# Patient Record
Sex: Male | Born: 1945 | Race: White | Hispanic: No | Marital: Married | Smoking: Former smoker
Health system: Southern US, Community
[De-identification: ages and names within clinical notes are randomized; demographics above are authoritative.]

## PROBLEM LIST (undated history)

## (undated) DIAGNOSIS — I10 Essential (primary) hypertension: Secondary | ICD-10-CM

## (undated) DIAGNOSIS — C719 Malignant neoplasm of brain, unspecified: Secondary | ICD-10-CM

## (undated) DIAGNOSIS — E119 Type 2 diabetes mellitus without complications: Secondary | ICD-10-CM

## (undated) DIAGNOSIS — C801 Malignant (primary) neoplasm, unspecified: Secondary | ICD-10-CM

## (undated) DIAGNOSIS — N200 Calculus of kidney: Secondary | ICD-10-CM

## (undated) DIAGNOSIS — M1990 Unspecified osteoarthritis, unspecified site: Secondary | ICD-10-CM

## (undated) DIAGNOSIS — C3490 Malignant neoplasm of unspecified part of unspecified bronchus or lung: Secondary | ICD-10-CM

## (undated) DIAGNOSIS — R55 Syncope and collapse: Secondary | ICD-10-CM

## (undated) DIAGNOSIS — J189 Pneumonia, unspecified organism: Secondary | ICD-10-CM

## (undated) DIAGNOSIS — D649 Anemia, unspecified: Secondary | ICD-10-CM

## (undated) DIAGNOSIS — R0609 Other forms of dyspnea: Secondary | ICD-10-CM

## (undated) DIAGNOSIS — R0600 Dyspnea, unspecified: Secondary | ICD-10-CM

## (undated) DIAGNOSIS — K219 Gastro-esophageal reflux disease without esophagitis: Secondary | ICD-10-CM

## (undated) DIAGNOSIS — G4730 Sleep apnea, unspecified: Secondary | ICD-10-CM

## (undated) HISTORY — PX: EYE SURGERY: SHX253

## (undated) HISTORY — PX: TONSILLECTOMY: SUR1361

## (undated) HISTORY — PX: CARDIAC CATHETERIZATION: SHX172

## (undated) HISTORY — PX: HIP SURGERY: SHX245

---

## 2000-01-20 ENCOUNTER — Encounter: Payer: Self-pay | Admitting: Emergency Medicine

## 2000-01-20 ENCOUNTER — Emergency Department (HOSPITAL_COMMUNITY): Admission: EM | Admit: 2000-01-20 | Discharge: 2000-01-20 | Payer: Self-pay | Admitting: *Deleted

## 2000-01-31 ENCOUNTER — Ambulatory Visit (HOSPITAL_COMMUNITY): Admission: RE | Admit: 2000-01-31 | Discharge: 2000-01-31 | Payer: Self-pay | Admitting: Urology

## 2000-01-31 ENCOUNTER — Encounter: Payer: Self-pay | Admitting: Urology

## 2001-12-09 ENCOUNTER — Emergency Department (HOSPITAL_COMMUNITY): Admission: EM | Admit: 2001-12-09 | Discharge: 2001-12-09 | Payer: Self-pay | Admitting: Emergency Medicine

## 2003-03-18 ENCOUNTER — Emergency Department (HOSPITAL_COMMUNITY): Admission: EM | Admit: 2003-03-18 | Discharge: 2003-03-18 | Payer: Self-pay | Admitting: Emergency Medicine

## 2003-10-30 ENCOUNTER — Emergency Department (HOSPITAL_COMMUNITY): Admission: EM | Admit: 2003-10-30 | Discharge: 2003-10-30 | Payer: Self-pay | Admitting: Emergency Medicine

## 2003-12-16 ENCOUNTER — Ambulatory Visit (HOSPITAL_COMMUNITY): Admission: RE | Admit: 2003-12-16 | Discharge: 2003-12-16 | Payer: Self-pay | Admitting: *Deleted

## 2005-03-19 ENCOUNTER — Emergency Department (HOSPITAL_COMMUNITY): Admission: EM | Admit: 2005-03-19 | Discharge: 2005-03-19 | Payer: Self-pay | Admitting: Emergency Medicine

## 2010-06-30 ENCOUNTER — Other Ambulatory Visit (HOSPITAL_COMMUNITY): Payer: Self-pay | Admitting: Cardiovascular Disease

## 2010-06-30 DIAGNOSIS — I6529 Occlusion and stenosis of unspecified carotid artery: Secondary | ICD-10-CM

## 2010-07-07 ENCOUNTER — Ambulatory Visit (HOSPITAL_COMMUNITY)
Admission: RE | Admit: 2010-07-07 | Discharge: 2010-07-07 | Disposition: A | Discharge status: Home / Self Care | Payer: 59 | Source: Ambulatory Visit | Attending: Cardiovascular Disease | Admitting: Cardiovascular Disease

## 2010-07-07 DIAGNOSIS — I6529 Occlusion and stenosis of unspecified carotid artery: Secondary | ICD-10-CM | POA: Insufficient documentation

## 2010-07-07 MED ORDER — IOHEXOL 350 MG/ML SOLN
100.0000 mL | Freq: Once | INTRAVENOUS | Status: AC | PRN
  Administered 2010-07-07: 100 mL via INTRAVENOUS

## 2011-03-08 HISTORY — PX: CATARACT EXTRACTION W/ INTRAOCULAR LENS  IMPLANT, BILATERAL: SHX1307

## 2011-05-11 ENCOUNTER — Ambulatory Visit
Admission: RE | Admit: 2011-05-11 | Discharge: 2011-05-11 | Disposition: A | Discharge status: Home / Self Care | Payer: 59 | Source: Ambulatory Visit | Attending: Cardiovascular Disease | Admitting: Cardiovascular Disease

## 2011-05-11 ENCOUNTER — Other Ambulatory Visit: Payer: Self-pay | Admitting: Cardiovascular Disease

## 2011-05-11 DIAGNOSIS — F17200 Nicotine dependence, unspecified, uncomplicated: Secondary | ICD-10-CM

## 2011-05-14 ENCOUNTER — Encounter (HOSPITAL_COMMUNITY): Payer: Self-pay | Admitting: Pharmacy Technician

## 2011-05-15 ENCOUNTER — Encounter (HOSPITAL_COMMUNITY): Payer: Self-pay | Admitting: *Deleted

## 2011-05-15 ENCOUNTER — Encounter (HOSPITAL_COMMUNITY)
Admission: RE | Disposition: A | Discharge status: Home / Self Care | Payer: Self-pay | Source: Ambulatory Visit | Attending: Surgery

## 2011-05-15 ENCOUNTER — Inpatient Hospital Stay (HOSPITAL_COMMUNITY)
Admission: RE | Admit: 2011-05-15 | Discharge: 2011-05-16 | DRG: 036 | Disposition: A | Discharge status: Home / Self Care | Payer: 59 | Source: Ambulatory Visit | Attending: Surgery | Admitting: Surgery

## 2011-05-15 DIAGNOSIS — I658 Occlusion and stenosis of other precerebral arteries: Secondary | ICD-10-CM | POA: Diagnosis present

## 2011-05-15 DIAGNOSIS — Z8249 Family history of ischemic heart disease and other diseases of the circulatory system: Secondary | ICD-10-CM

## 2011-05-15 DIAGNOSIS — I6529 Occlusion and stenosis of unspecified carotid artery: Principal | ICD-10-CM | POA: Diagnosis present

## 2011-05-15 DIAGNOSIS — D649 Anemia, unspecified: Secondary | ICD-10-CM | POA: Diagnosis present

## 2011-05-15 DIAGNOSIS — I10 Essential (primary) hypertension: Secondary | ICD-10-CM | POA: Diagnosis present

## 2011-05-15 DIAGNOSIS — F17200 Nicotine dependence, unspecified, uncomplicated: Secondary | ICD-10-CM | POA: Diagnosis present

## 2011-05-15 DIAGNOSIS — E119 Type 2 diabetes mellitus without complications: Secondary | ICD-10-CM | POA: Diagnosis present

## 2011-05-15 DIAGNOSIS — R079 Chest pain, unspecified: Secondary | ICD-10-CM | POA: Diagnosis present

## 2011-05-15 DIAGNOSIS — I2510 Atherosclerotic heart disease of native coronary artery without angina pectoris: Secondary | ICD-10-CM | POA: Diagnosis present

## 2011-05-15 HISTORY — PX: CAROTID STENT INSERTION: SHX5505

## 2011-05-15 HISTORY — PX: LEFT HEART CATHETERIZATION WITH CORONARY ANGIOGRAM: SHX5451

## 2011-05-15 HISTORY — DX: Anemia, unspecified: D64.9

## 2011-05-15 HISTORY — DX: Essential (primary) hypertension: I10

## 2011-05-15 HISTORY — PX: CAROTID ENDARTERECTOMY: SUR193

## 2011-05-15 LAB — GLUCOSE, CAPILLARY
Glucose-Capillary: 105 mg/dL — ABNORMAL HIGH (ref 70–99)
Glucose-Capillary: 236 mg/dL — ABNORMAL HIGH (ref 70–99)
Glucose-Capillary: 120 mg/dL — ABNORMAL HIGH (ref 70–99)
Glucose-Capillary: 205 mg/dL — ABNORMAL HIGH (ref 70–99)

## 2011-05-15 LAB — POCT ACTIVATED CLOTTING TIME: Activated Clotting Time: 292 seconds

## 2011-05-15 SURGERY — LEFT HEART CATHETERIZATION WITH CORONARY ANGIOGRAM
Anesthesia: LOCAL

## 2011-05-15 SURGERY — CAROTID STENT INSERTION
Anesthesia: LOCAL

## 2011-05-15 MED ORDER — ASPIRIN EC 81 MG PO TBEC: 81.0000 mg | DELAYED_RELEASE_TABLET | Freq: Every day | ORAL | Status: DC

## 2011-05-15 MED ORDER — OLMESARTAN MEDOXOMIL 20 MG PO TABS
20.0000 mg | ORAL_TABLET | Freq: Every day | ORAL | Status: DC
  Administered 2011-05-15: 20 mg via ORAL
  Filled 2011-05-15 (×2): qty 1

## 2011-05-15 MED ORDER — SODIUM CHLORIDE 0.9 % IJ SOLN: 3.0000 mL | INTRAMUSCULAR | Status: DC | PRN

## 2011-05-15 MED ORDER — PIOGLITAZONE HCL-METFORMIN HCL 15-850 MG PO TABS: 1.0000 | ORAL_TABLET | ORAL | Status: DC

## 2011-05-15 MED ORDER — ASPIRIN 81 MG PO CHEW
324.0000 mg | CHEWABLE_TABLET | Freq: Once | ORAL | Status: AC
  Administered 2011-05-15: 324 mg via ORAL

## 2011-05-15 MED ORDER — CLOPIDOGREL BISULFATE 75 MG PO TABS: 75.0000 mg | ORAL_TABLET | ORAL | Status: DC

## 2011-05-15 MED ORDER — FERROUS SULFATE 325 (65 FE) MG PO TABS
325.0000 mg | ORAL_TABLET | Freq: Every day | ORAL | Status: DC
Start: 2011-05-16 — End: 2011-05-16
  Administered 2011-05-16: 325 mg via ORAL
  Filled 2011-05-15 (×2): qty 1

## 2011-05-15 MED ORDER — ACETAMINOPHEN 325 MG PO TABS: 650.0000 mg | ORAL_TABLET | ORAL | Status: DC | PRN

## 2011-05-15 MED ORDER — METFORMIN HCL 850 MG PO TABS
850.0000 mg | ORAL_TABLET | Freq: Every day | ORAL | Status: DC
  Filled 2011-05-15 (×2): qty 1

## 2011-05-15 MED ORDER — SODIUM CHLORIDE 0.9 % IV SOLN: INTRAVENOUS | Status: AC

## 2011-05-15 MED ORDER — PIOGLITAZONE HCL 15 MG PO TABS
15.0000 mg | ORAL_TABLET | Freq: Every day | ORAL | Status: DC
  Administered 2011-05-16: 15 mg via ORAL
  Filled 2011-05-15 (×2): qty 1

## 2011-05-15 MED ORDER — NOREPINEPHRINE BITARTRATE 1 MG/ML IJ SOLN
INTRAMUSCULAR | Status: AC
  Filled 2011-05-15: qty 4

## 2011-05-15 MED ORDER — ONDANSETRON HCL 4 MG/2ML IJ SOLN: 4.0000 mg | Freq: Four times a day (QID) | INTRAMUSCULAR | Status: DC | PRN

## 2011-05-15 MED ORDER — ATROPINE SULFATE 1 MG/ML IJ SOLN
INTRAMUSCULAR | Status: AC
  Filled 2011-05-15: qty 1

## 2011-05-15 MED ORDER — SODIUM CHLORIDE 0.9 % IV SOLN
INTRAVENOUS | Status: DC
  Administered 2011-05-15: 06:00:00 via INTRAVENOUS

## 2011-05-15 MED ORDER — ASPIRIN 81 MG PO CHEW
CHEWABLE_TABLET | ORAL | Status: AC
  Filled 2011-05-15: qty 4

## 2011-05-15 MED ORDER — HEPARIN (PORCINE) IN NACL 2-0.9 UNIT/ML-% IJ SOLN
INTRAMUSCULAR | Status: AC
  Filled 2011-05-15: qty 1000

## 2011-05-15 MED ORDER — CLOPIDOGREL BISULFATE 75 MG PO TABS
75.0000 mg | ORAL_TABLET | Freq: Every day | ORAL | Status: DC
  Filled 2011-05-15 (×2): qty 1

## 2011-05-15 MED ORDER — LIDOCAINE HCL (PF) 1 % IJ SOLN
INTRAMUSCULAR | Status: AC
  Filled 2011-05-15: qty 30

## 2011-05-15 MED ORDER — BIVALIRUDIN 250 MG IV SOLR
INTRAVENOUS | Status: AC
  Filled 2011-05-15: qty 250

## 2011-05-15 MED ORDER — ASPIRIN EC 325 MG PO TBEC
325.0000 mg | DELAYED_RELEASE_TABLET | Freq: Every day | ORAL | Status: DC
  Filled 2011-05-15 (×2): qty 1

## 2011-05-15 NOTE — Research (Signed)
CANOPY Informed Consent   Subject Name: Aaron Kerr  Subject met inclusion and exclusion criteria.  The informed consent form, study requirements and expectations were reviewed with the subject and questions and concerns were addressed prior to the signing of the consent form.  The subject verbalized understanding of the trail requirements.  The subject agreed to participate in the [facility] trial and signed the informed consent.  The informed consent was obtained prior to performance of any protocol-specific procedures for the subject.  A copy of the signed informed consent was given to the subject and a copy was placed in the subject's medical record.  Brunilda Payor 05/15/2011, 11:40 AM

## 2011-05-15 NOTE — H&P (Signed)
H & P will be scanned in.  Pt was reexamined and existing H & P reviewed. No changes found.  Runell Gess, MD [facility] 05/15/2011 7:12 AM

## 2011-05-15 NOTE — Op Note (Signed)
Aaron Kerr is a 66 y.o. male    [id number] LOCATION:  FACILITY: [facility]  PHYSICIAN: Nanetta Batty, M.D. [birth year]   DATE OF PROCEDURE:  05/15/2011  DATE OF DISCHARGE:  [facility]  CARDIAC CATHETERIZATION     History obtained from chart review. The patient is a 66 year old married Caucasian male father 11, grandfather and 2 grandchildren, who works as an Therapist, sports. His risk factors include a 50-pack-year history of tobacco abuse still smoking approximately one pack per day, treated hypertension, and diabetes. He also has a strong family history of heart disease the father with bypass surgery at age 3 and a brother who died of A. Myocardial infarction at age 25. He has never had a heart attack or stroke. He denies chest pain or shortness of breath. He also denies claudication. He does complain of some left hip pain. Dopplers performed in the office 04/13/2010 revealed a high-grade right internal carotid artery stenosis and a moderate left internal carotid artery stenosis. A CT and 2 g revealed moderate disease bilaterally. A Myoview stress test showed distal antero-apical ischemia. I have arranged for him to undergo cardiac catheterization and cerebral angiography by myself back in March of last year however he canceled at that time. His Dopplers have shown progression of disease. He presents now for cardiac catheterization and potential carotid artery stenting depending on the severity of his carotid disease and his coronary anatomy.   PROCEDURE DESCRIPTION:    The patient was brought to the second floor  [facility] lab in the postabsorptive state. He was not  premedicated his right groin was prepped and shaved in usual sterile fashion. Xylocaine 1% was used  for local anesthesia. A 5 French sheath was inserted into the right common femoral  artery using standard Seldinger technique. 5 French right and left Judkins catheters were  used for selective coronary angiography.    HEMODYNAMICS:    AO SYSTOLIC/AO DIASTOLIC: 192/78     ANGIOGRAPHIC RESULTS:   1. Left main; normal  2. LAD; 30% proximal 3. Left circumflex; normal and dominant.  4. Right coronary artery; nondominant with 30% proximal   5. Left ventriculography was not performed  IMPRESSION:[name] has minimal coronary disease with normal left ventricular function by gated SPECT and is a good candidate to undergo carotid artery stenting based on his normal coronary anatomy.  Runell Gess MD, [facility] 05/15/2011 9:01 AM

## 2011-05-15 NOTE — Op Note (Signed)
[facility]   Patient name: Aaron Kerr MRN: [id number] DOB: 02/11/1946 Sex: male  05/15/2011 Pre-operative Diagnosis: Asymptomatic right carotid stenosis Post-operative diagnosis:  Same Surgeon:  Jorge Ny, Nanetta Batty, M.D. Procedure Performed:  1.  aortic arch angiogram  2.  bilateral carotid angiogram  3.  second order catheterization (left carotid artery, bovine arch)  4.  right carotid stenting with distal embolic protection    Indications:  The patient was seen in preoperative consultation for vascular clearance prior to hip replacement. He had a positive Myoview and a high-grade right carotid stenosis by Doppler. He comes in today for coronary angiography and carotid angiogram with the possibility of a right carotid stenting under the Canopy  Protocol.    Procedure:  Please see coronary angiogram note by Dr. Allyson Sabal for details of access and coronary findings. A aortic arch angiogram was performed. The innominate artery was cannulated with a JB 1 catheter and selected images were obtained with the JB 1 catheter in both the right common carotid and left common carotid artery. Intracranial images will be separately dictated by the neuroradiology.  Findings:   Aortic Arch Angiogram: A bovine aortic arch is identified. The innominate and right subclavian artery are widely patent. The right vertebral originates from the right subclavian without significant disease. The proximal right and left common carotid arteries are widely patent. The left subclavian artery is widely patent. Left vertebral artery arises from the left subclavian artery and is widely patent.  Right carotid artery:  The right common carotid artery is patent proximally at the bifurcation and there is calcific disease which extends into the internal and external carotid artery. There is approximately 90% stenosis within the proximal internal carotid artery. The external carotid artery  is patent.  Left Carotid Artery:  The left common carotid artery is widely patent. The left internal and external carotid arteries are patent. There is approximately 20% stenosis.  Intervention:  At this point the decision was made to intervene under the canopy protocol. The sheath was exchanged out to a long 6 [place] sheath. Using a JB 1 catheter the innominate artery was selected. An Amplatz superstiff wire was advanced into the internal carotid artery making sure it did not cross the stenosis. The catheter was then slowly advanced over the wire. The sheath was then advanced over the catheter until the sheath was in good position in the mid right common carotid artery. At this point an Angiomax bolus was initiated. We confirmed that the ACT was greater than 300. Once this was confirmed a large NAV 6 filter was placed into a straight portion of the distal internal carotid artery. The stenosis was then dilated using a 3 mm balloon. There was mild hemodynamic changes. Next the stent was prepared on the back table this was a 7 x 10 x 30 Acculink.  This was advanced across the stenosis and then successfully deployed. Next a 5 x 2 balloon was used to mold the stent. The patient received 1/2 mg atropine prior to balloon inflation and an additional half milligram after balloon inflation. A completion angiogram was then performed which showed less than 10% residual stenosis. Intracranial images were performed which showed no changes when compared to preprocedural imaging. These will be separately dictated by the neuroradiology. The patient remained neurologically intact. The filter was then successfully retrieved. There was no debris within the filter. The long sheath was exchanged out over a wire for a short 6 [place] sheath.  Neurologic testing confirmed that the patient had no neurologic deficits. The Angiomax drip was discontinued. The patient be taken to the holding area for sheath removal once his coagulation  profile has corrected.  Impression:  #1  successful stenting of the right carotid artery using a 7 x 10 x 30 Acculink stent with a large NAV 6 filter. Pre intervention stenosis was 90%, postintervention stenosis was less than 10%  #2  bovine aortic arch  #3  20% left carotid stenosis     V. Durene Cal, M.D. [facility] [place] Office: [phone] Pager:  [phone]

## 2011-05-16 ENCOUNTER — Other Ambulatory Visit: Payer: Self-pay | Admitting: Thoracic Diseases

## 2011-05-16 DIAGNOSIS — I6529 Occlusion and stenosis of unspecified carotid artery: Secondary | ICD-10-CM

## 2011-05-16 LAB — GLUCOSE, CAPILLARY

## 2011-05-16 LAB — CBC
Hemoglobin: 11.5 g/dL — ABNORMAL LOW (ref 13.0–17.0)
RBC: 3.97 MIL/uL — ABNORMAL LOW (ref 4.22–5.81)
WBC: 6.8 10*3/uL (ref 4.0–10.5)

## 2011-05-16 LAB — BASIC METABOLIC PANEL
GFR calc Af Amer: >90 mL/min (ref >90)
GFR calc non Af Amer: >90 mL/min (ref >90)
Potassium: 4.3 mEq/L (ref 3.5–5.1)
Sodium: 140 mEq/L (ref 135–145)

## 2011-05-16 MED FILL — Dextrose Inj 5%: INTRAVENOUS | Qty: 50 | Status: AC

## 2011-05-16 NOTE — Progress Notes (Signed)
Utilization review completed. Rosalynd Mcwright, RN, BSN. 05/16/11  

## 2011-05-16 NOTE — Progress Notes (Signed)
Subjective:  Pt doing well this am, no report of bleeding concerns in rt groin s/p LHC. pt with good BP and no report of MS changes s/p RICA stent 1/8.  Objective:  Vital Signs in the last 24 hours: Temp:  [97.5 F (36.4 C)-98.7 F (37.1 C)] 97.9 F (36.6 C) (01/08 2345) Pulse Rate:  [53-69] 53  (01/08 1924) Resp:  [14-18] 16  (01/08 1924) BP: (110-163)/(42-79) 118/79 mmHg (01/09 0355) SpO2:  [95 %-100 %] 95 % (01/08 2345) Weight:  [83.915 kg (185 lb)-84.9 kg (187 lb 2.7 oz)] 84.9 kg (187 lb 2.7 oz) (01/08 1445)  Intake/Output from previous day: 01/08 0701 - 01/09 0700 In: 240 [P.O.:240] Out: -  Intake/Output from this shift: Total I/O In: 240 [P.O.:240] Out: -   Physical Exam: PE not performed  Lab Results: No results found for this basename: WBC:2,HGB:2,PLT:2 in the last 72 hours No results found for this basename: NA:2,K:2,CL:2,CO2:2,GLUCOSE:2,BUN:2,CREATININE:2 in the last 72 hours No results found for this basename: TROPONINI:2,CK,MB:2 in the last 72 hours Hepatic Function Panel No results found for this basename: PROT,ALBUMIN,AST,ALT,ALKPHOS,BILITOT,BILIDIR,IBILI in the last 72 hours No results found for this basename: CHOL in the last 72 hours No results found for this basename: PROTIME in the last 72 hours  Imaging: Imaging results have been reviewed  Cardiac Studies:  Assessment/Plan:  1) s/p LHC 1/8 with min CAD & NLVSF with out post op complications 2) s/p RICA stent  LOS: 1 day    Litzy Dicker E 05/16/2011, 5:49 AM

## 2011-05-16 NOTE — Plan of Care (Signed)
Problem: Phase I Progression Outcomes Goal: Vascular site scale level 0 - I Vascular Site Scale Level 0: No bruising/bleeding/hematoma Level I (Mild): Bruising/Ecchymosis, minimal bleeding/ooozing, palpable hematoma < 3 cm Level II (Moderate): Bleeding not affecting hemodynamic parameters, pseudoaneurysm, palpable hematoma > 3 cm  Outcome: Completed/Met Date Met:  05/16/11 Level 1  Comments:  r groin Level 1

## 2011-05-16 NOTE — Progress Notes (Signed)
Pt. Seen and examined. Agree with the NP/PA-C note as written. Feeling well. No complaints. Mild ecchymosis of the right groin, no hematoma or bruit. No new neurologic symptoms. Ok for d/c today, follow-up with Dr. Allyson Sabal.  Chrystie Nose, MD Attending Cardiologist [facility]

## 2011-05-16 NOTE — Discharge Summary (Deleted)
[facility] Discharge Summary   Patient ID:  Aaron Kerr MRN: [id number] DOB/AGE: 66/27/1947 66 y.o.  Admit date: 05/15/2011 Discharge date: 05/16/2011 Date of Surgery: 05/15/2011 Surgeon: Surgeon(s): Seth Bake Durene Cal, MD Runell Gess, MD  Admission Diagnosis: Critical right carotid stenosis, asymptomatic stenosis chest pain  Discharge Diagnoses: Critical right carotid stenosis, asymptomatic/ S/P carotid stent right stenosis chest pain  Secondary Diagnoses: Past Medical History  Diagnosis Date  . Diabetes mellitus   . Anemia   . Hypertension     Procedures: Procedure(s): CAROTID STENT INSERTION - right ICA LEFT HEART CATHETERIZATION WITH CORONARY ANGIOGRAM  Discharged Condition: good  HPI:  Aaron Kerr is a 66 y.o. male who was seen in preoperative consultation for vascular clearance prior to hip replacement. He had a positive Myoview and a high-grade right carotid stenosis by Doppler of greater than 90%. He denied symptoms of stroke, TIA. He comes in today for coronary angiography and carotid angiogram with the possibility of a right carotid stenting under the Canopy Protocol.   Hospital Course:  Aaron Kerr is a 66 y.o. male is S/P Right Procedure(s): CAROTID STENT INSERTION LEFT HEART CATHETERIZATION WITH CORONARY ANGIOGRAM  Post-op wounds without hematoma and healing well Pt. Ambulating, voiding and taking PO diet without difficulty. Neuro exam intact and WNL No H/A, no facial droop Good and equal strength BUE/BLE Labs as below Complications:none  Consults:     Significant Diagnostic Studies: CBC    Component Value Date/Time   WBC 6.8 05/16/2011 0517   RBC 3.97* 05/16/2011 0517   HGB 11.5* 05/16/2011 0517   HCT 34.9* 05/16/2011 0517   PLT 220 05/16/2011 0517   MCV 87.9 05/16/2011 0517   MCH 29.0 05/16/2011 0517   MCHC 33.0 05/16/2011 0517   RDW 13.4 05/16/2011 0517    BMET    Component Value Date/Time   NA 140 05/16/2011 0517   K 4.3  05/16/2011 0517   CL 104 05/16/2011 0517   CO2 27 05/16/2011 0517   GLUCOSE 99 05/16/2011 0517   BUN 17 05/16/2011 0517   CREATININE 0.82 05/16/2011 0517   CALCIUM 9.3 05/16/2011 0517   GFRNONAA >90 05/16/2011 0517   GFRAA >90 05/16/2011 0517    COAG No results found for this basename: INR, PROTIME     Disposition:  Discharge to :Home Discharge Orders    Future Orders Please Complete By Expires   Resume previous diet      Driving Restrictions      Comments:   No driving for 1 weeks   Lifting restrictions      Comments:   No lifting for 4 weeks   Call MD for:  temperature >100.5      Call MD for:  redness, tenderness, or signs of infection (pain, swelling, bleeding, redness, odor or green/yellow discharge around incision site)      Call MD for:  severe or increased pain, loss or decreased feeling  in affected limb(s)      Increase activity slowly      Comments:   Walk with assistance use walker or cane as needed   May shower       Scheduling Instructions:   Friday   CAROTID Sugery: Call MD for difficulty swallowing or speaking; weakness in arms or legs that is a new symtom; severe headache.  If you have increased swelling in the neck and/or  are having difficulty breathing, CALL 911         Aaron Kerr,  Aaron Kerr  Home Medication Instructions [id number]   Printed on:05/16/11 0843  Medication Information                    pioglitazone-metformin (ACTOPLUS MET) 15-850 MG per tablet Take 1 tablet by mouth every morning.             irbesartan (AVAPRO) 300 MG tablet Take 300 mg by mouth every morning.             aspirin EC 81 MG tablet Take 81 mg by mouth at bedtime.             ferrous sulfate (IRON SUPPLEMENT) 325 (65 FE) MG tablet Take 325 mg by mouth daily with breakfast.             clopidogrel (PLAVIX) 75 MG tablet Take 75 mg by mouth.              Verbal and written Discharge instructions given to the patient. Wound care per Discharge AVS Follow-up Information    Follow up  with Myra Gianotti IV, Lala Lund, MD in 4 weeks. (office will arrange - sent , duplex ordered)    Contact information:   [address] [place] [address] [phone]          Signed: Marlowe Shores 05/16/2011, 8:43 AM

## 2011-09-13 ENCOUNTER — Inpatient Hospital Stay (HOSPITAL_BASED_OUTPATIENT_CLINIC_OR_DEPARTMENT_OTHER)
Admission: EM | Admit: 2011-09-13 | Discharge: 2011-09-16 | DRG: 193 | Disposition: A | Discharge status: Home / Self Care | Payer: 59 | Attending: Internal Medicine | Admitting: Internal Medicine

## 2011-09-13 ENCOUNTER — Encounter (HOSPITAL_BASED_OUTPATIENT_CLINIC_OR_DEPARTMENT_OTHER): Payer: Self-pay | Admitting: *Deleted

## 2011-09-13 ENCOUNTER — Emergency Department (INDEPENDENT_AMBULATORY_CARE_PROVIDER_SITE_OTHER): Payer: 59

## 2011-09-13 DIAGNOSIS — E860 Dehydration: Secondary | ICD-10-CM | POA: Diagnosis present

## 2011-09-13 DIAGNOSIS — R55 Syncope and collapse: Secondary | ICD-10-CM

## 2011-09-13 DIAGNOSIS — I2489 Other forms of acute ischemic heart disease: Secondary | ICD-10-CM | POA: Diagnosis present

## 2011-09-13 DIAGNOSIS — I219 Acute myocardial infarction, unspecified: Secondary | ICD-10-CM | POA: Diagnosis present

## 2011-09-13 DIAGNOSIS — N179 Acute kidney failure, unspecified: Secondary | ICD-10-CM | POA: Diagnosis present

## 2011-09-13 DIAGNOSIS — E871 Hypo-osmolality and hyponatremia: Secondary | ICD-10-CM | POA: Diagnosis present

## 2011-09-13 DIAGNOSIS — Z9849 Cataract extraction status, unspecified eye: Secondary | ICD-10-CM

## 2011-09-13 DIAGNOSIS — R05 Cough: Secondary | ICD-10-CM

## 2011-09-13 DIAGNOSIS — R059 Cough, unspecified: Secondary | ICD-10-CM

## 2011-09-13 DIAGNOSIS — J449 Chronic obstructive pulmonary disease, unspecified: Secondary | ICD-10-CM | POA: Diagnosis present

## 2011-09-13 DIAGNOSIS — J189 Pneumonia, unspecified organism: Principal | ICD-10-CM

## 2011-09-13 DIAGNOSIS — A088 Other specified intestinal infections: Secondary | ICD-10-CM | POA: Diagnosis present

## 2011-09-13 DIAGNOSIS — Z7982 Long term (current) use of aspirin: Secondary | ICD-10-CM

## 2011-09-13 DIAGNOSIS — Z79899 Other long term (current) drug therapy: Secondary | ICD-10-CM

## 2011-09-13 DIAGNOSIS — I248 Other forms of acute ischemic heart disease: Secondary | ICD-10-CM | POA: Diagnosis present

## 2011-09-13 DIAGNOSIS — Z720 Tobacco use: Secondary | ICD-10-CM | POA: Diagnosis present

## 2011-09-13 DIAGNOSIS — R5381 Other malaise: Secondary | ICD-10-CM

## 2011-09-13 DIAGNOSIS — Z888 Allergy status to other drugs, medicaments and biological substances status: Secondary | ICD-10-CM

## 2011-09-13 DIAGNOSIS — J4489 Other specified chronic obstructive pulmonary disease: Secondary | ICD-10-CM

## 2011-09-13 DIAGNOSIS — K219 Gastro-esophageal reflux disease without esophagitis: Secondary | ICD-10-CM | POA: Diagnosis present

## 2011-09-13 DIAGNOSIS — M129 Arthropathy, unspecified: Secondary | ICD-10-CM | POA: Diagnosis present

## 2011-09-13 DIAGNOSIS — R0602 Shortness of breath: Secondary | ICD-10-CM

## 2011-09-13 DIAGNOSIS — Z87442 Personal history of urinary calculi: Secondary | ICD-10-CM

## 2011-09-13 DIAGNOSIS — E119 Type 2 diabetes mellitus without complications: Secondary | ICD-10-CM | POA: Diagnosis present

## 2011-09-13 DIAGNOSIS — I6529 Occlusion and stenosis of unspecified carotid artery: Secondary | ICD-10-CM | POA: Diagnosis present

## 2011-09-13 DIAGNOSIS — I959 Hypotension, unspecified: Secondary | ICD-10-CM | POA: Diagnosis present

## 2011-09-13 DIAGNOSIS — D649 Anemia, unspecified: Secondary | ICD-10-CM | POA: Diagnosis present

## 2011-09-13 DIAGNOSIS — F17200 Nicotine dependence, unspecified, uncomplicated: Secondary | ICD-10-CM | POA: Diagnosis present

## 2011-09-13 DIAGNOSIS — Z7902 Long term (current) use of antithrombotics/antiplatelets: Secondary | ICD-10-CM

## 2011-09-13 HISTORY — DX: Gastro-esophageal reflux disease without esophagitis: K21.9

## 2011-09-13 HISTORY — DX: Dyspnea, unspecified: R06.00

## 2011-09-13 HISTORY — DX: Syncope and collapse: R55

## 2011-09-13 HISTORY — DX: Other forms of dyspnea: R06.09

## 2011-09-13 HISTORY — DX: Pneumonia, unspecified organism: J18.9

## 2011-09-13 HISTORY — DX: Sleep apnea, unspecified: G47.30

## 2011-09-13 HISTORY — DX: Calculus of kidney: N20.0

## 2011-09-13 HISTORY — DX: Type 2 diabetes mellitus without complications: E11.9

## 2011-09-13 HISTORY — DX: Unspecified osteoarthritis, unspecified site: M19.90

## 2011-09-13 LAB — COMPREHENSIVE METABOLIC PANEL
ALT: 37 U/L (ref 0–53)
BUN: 43 mg/dL — ABNORMAL HIGH (ref 6–23)
CO2: 21 mEq/L (ref 19–32)
Calcium: 8.4 mg/dL (ref 8.4–10.5)
Creatinine, Ser: 2 mg/dL — ABNORMAL HIGH (ref 0.50–1.35)
GFR calc Af Amer: 39 mL/min — ABNORMAL LOW (ref >90)
GFR calc non Af Amer: 33 mL/min — ABNORMAL LOW (ref >90)
Glucose, Bld: 109 mg/dL — ABNORMAL HIGH (ref 70–99)
Total Protein: 6.6 g/dL (ref 6.0–8.3)

## 2011-09-13 LAB — DIFFERENTIAL
Eosinophils Absolute: 0 10*3/uL (ref 0.0–0.7)
Eosinophils Relative: 0 % (ref 0–5)
Lymphocytes Relative: 3 % — ABNORMAL LOW (ref 12–46)
Lymphs Abs: 0.3 10*3/uL — ABNORMAL LOW (ref 0.7–4.0)
Monocytes Absolute: 0.4 10*3/uL (ref 0.1–1.0)
Monocytes Relative: 4 % (ref 3–12)

## 2011-09-13 LAB — LACTIC ACID, PLASMA: Lactic Acid, Venous: 1.1 mmol/L (ref 0.5–2.2)

## 2011-09-13 LAB — CBC
HCT: 30.9 % — ABNORMAL LOW (ref 39.0–52.0)
MCH: 29.6 pg (ref 26.0–34.0)
MCV: 83.1 fL (ref 78.0–100.0)
Platelets: 125 10*3/uL — ABNORMAL LOW (ref 150–400)
RBC: 3.72 MIL/uL — ABNORMAL LOW (ref 4.22–5.81)
WBC: 10.5 10*3/uL (ref 4.0–10.5)

## 2011-09-13 MED ORDER — DEXTROSE 5 % IV SOLN
500.0000 mg | INTRAVENOUS | Status: DC
  Administered 2011-09-13 – 2011-09-15 (×3): 500 mg via INTRAVENOUS
  Filled 2011-09-13 (×4): qty 500

## 2011-09-13 MED ORDER — DEXTROSE 5 % IV SOLN
1.0000 g | INTRAVENOUS | Status: DC
  Administered 2011-09-13 – 2011-09-15 (×3): 1 g via INTRAVENOUS
  Filled 2011-09-13 (×4): qty 10

## 2011-09-13 MED ORDER — ONDANSETRON HCL 4 MG/2ML IJ SOLN
INTRAMUSCULAR | Status: AC
  Filled 2011-09-13: qty 2

## 2011-09-13 MED ORDER — SODIUM CHLORIDE 0.9 % IV SOLN
Freq: Once | INTRAVENOUS | Status: AC
  Administered 2011-09-13: 1000 mL via INTRAVENOUS

## 2011-09-13 MED ORDER — ACETAMINOPHEN 325 MG PO TABS
650.0000 mg | ORAL_TABLET | Freq: Once | ORAL | Status: AC
  Administered 2011-09-13: 650 mg via ORAL
  Filled 2011-09-13: qty 2

## 2011-09-13 NOTE — Plan of Care (Signed)
Problem: Phase I Progression Outcomes Goal: Other Phase I Outcomes/Goals Outcome: Progressing Resolution of orthostatic hypotension is progressing with fluid resuscitation.

## 2011-09-13 NOTE — ED Provider Notes (Addendum)
History     CSN: [id number]  Arrival date & time 09/13/11  1746   First MD Initiated Contact with Patient 09/13/11 1755      Chief Complaint  Patient presents with  . Weakness    (Consider location/radiation/quality/duration/timing/severity/associated sxs/prior treatment) Patient is a 66 y.o. male presenting with weakness. The history is provided by the patient.  Weakness  Additional symptoms include weakness.   patient here with weakness x2 days associated with cough and shortness of breath. Some dysuria. No vomiting or diarrhea. Some dyspnea. No headache or neck pain. No photophobia or rashes. Patient tried to get up off his cast it was too weak and had to be helped up by his family. No medications used prior to arrival. No evidence of focal weakness  Past Medical History  Diagnosis Date  . Diabetes mellitus   . Anemia   . Hypertension     Past Surgical History  Procedure Date  . Tonsillectomy   . Cardiac catheterization   . Eye surgery     No family history on file.  History  Substance Use Topics  . Smoking status: Current Everyday Smoker -- 0.7 packs/day for 50 years    Types: Cigarettes  . Smokeless tobacco: Not on file  . Alcohol Use: 0.6 oz/week    1 Cans of beer per week      Review of Systems  Neurological: Positive for weakness.  All other systems reviewed and are negative.    Allergies  Meloxicam  Home Medications   Current Outpatient Rx  Name Route Sig Dispense Refill  . ASPIRIN EC 81 MG PO TBEC Oral Take 81 mg by mouth at bedtime.      . CLOPIDOGREL BISULFATE 75 MG PO TABS Oral Take 75 mg by mouth.      Di Kindle SULFATE 325 (65 FE) MG PO TABS Oral Take 325 mg by mouth daily with breakfast.      . IRBESARTAN 300 MG PO TABS Oral Take 300 mg by mouth every morning.      Marland Kitchen PIOGLITAZONE HCL-METFORMIN HCL 15-850 MG PO TABS Oral Take 1 tablet by mouth every morning.        BP 104/51  Pulse 104  Temp(Src) 100.8 F (38.2 C) (Oral)  Resp 20   Ht 5\' 9"  (1.753 m)  Wt 183 lb (83.008 kg)  BMI 27.02 kg/m2  SpO2 91%  Physical Exam  Nursing note and vitals reviewed. Constitutional: He is oriented to person, place, and time. He appears well-developed and well-nourished.  Non-toxic appearance. No distress.  HENT:  Head: Normocephalic and atraumatic.  Eyes: Conjunctivae, EOM and lids are normal. Pupils are equal, round, and reactive to light.  Neck: Normal range of motion. Neck supple. No tracheal deviation present. No mass present.  Cardiovascular: Regular rhythm and normal heart sounds.  Tachycardia present.  Exam reveals no gallop.   No murmur heard. Pulmonary/Chest: Effort normal and breath sounds normal. No stridor. No respiratory distress. He has no decreased breath sounds. He has no wheezes. He has no rhonchi. He has no rales.  Abdominal: Soft. Normal appearance and bowel sounds are normal. He exhibits no distension. There is no tenderness. There is no rebound and no CVA tenderness.  Musculoskeletal: Normal range of motion. He exhibits no edema and no tenderness.  Neurological: He is alert and oriented to person, place, and time. He has normal strength. No cranial nerve deficit or sensory deficit. GCS eye subscore is 4. GCS verbal subscore is 5. GCS  motor subscore is 6.  Skin: Skin is warm and dry. No abrasion and no rash noted.  Psychiatric: He has a normal mood and affect. His speech is normal and behavior is normal.    ED Course  Procedures (including critical care time)   Labs Reviewed  CBC  DIFFERENTIAL  COMPREHENSIVE METABOLIC PANEL  LACTIC ACID, PLASMA  URINALYSIS, ROUTINE W REFLEX MICROSCOPIC  URINE CULTURE   No results found.   No diagnosis found.    MDM  Patient given, force fever. Chest x-ray results noted and patient started on treatment for community-acquired pneumonia. He'll be transferred to [facility] for admission  9:49 PM Nurse informed me that the patient had a transient decrease in blood  pressure which was treated with IV fluids. This occurred when the patient went to stand up. He is dehydrated and likely this represents orthostatic changes. At time of transfer now the patient's blood pressure is 98/50 and he is asymptomatic and stable for transfer  9:57 PM bp at time of transfer 108/62--recorded by [facility]link     Toy Baker, MD 09/13/11 1921  Toy Baker, MD 09/13/11 1931  Toy Baker, MD 09/13/11 6213  Toy Baker, MD 09/13/11 2157

## 2011-09-13 NOTE — ED Notes (Signed)
[facility] notified of room number 3733-1 also RN notified

## 2011-09-13 NOTE — ED Notes (Signed)
Pt to room 7 by ems via stretcher. Pt reports several days of generalized weakness. Pt is awake and alert, oriented x 4 in nad.

## 2011-09-14 ENCOUNTER — Encounter (HOSPITAL_COMMUNITY): Payer: Self-pay | Admitting: General Practice

## 2011-09-14 DIAGNOSIS — E860 Dehydration: Secondary | ICD-10-CM | POA: Diagnosis present

## 2011-09-14 DIAGNOSIS — I6529 Occlusion and stenosis of unspecified carotid artery: Secondary | ICD-10-CM | POA: Diagnosis present

## 2011-09-14 DIAGNOSIS — J449 Chronic obstructive pulmonary disease, unspecified: Secondary | ICD-10-CM | POA: Diagnosis present

## 2011-09-14 DIAGNOSIS — N179 Acute kidney failure, unspecified: Secondary | ICD-10-CM | POA: Diagnosis present

## 2011-09-14 DIAGNOSIS — J189 Pneumonia, unspecified organism: Secondary | ICD-10-CM

## 2011-09-14 DIAGNOSIS — R748 Abnormal levels of other serum enzymes: Secondary | ICD-10-CM

## 2011-09-14 DIAGNOSIS — I959 Hypotension, unspecified: Secondary | ICD-10-CM | POA: Diagnosis present

## 2011-09-14 DIAGNOSIS — Z720 Tobacco use: Secondary | ICD-10-CM | POA: Diagnosis present

## 2011-09-14 DIAGNOSIS — D649 Anemia, unspecified: Secondary | ICD-10-CM | POA: Diagnosis present

## 2011-09-14 DIAGNOSIS — G4730 Sleep apnea, unspecified: Secondary | ICD-10-CM

## 2011-09-14 DIAGNOSIS — D696 Thrombocytopenia, unspecified: Secondary | ICD-10-CM

## 2011-09-14 DIAGNOSIS — E871 Hypo-osmolality and hyponatremia: Secondary | ICD-10-CM | POA: Diagnosis present

## 2011-09-14 HISTORY — DX: Sleep apnea, unspecified: G47.30

## 2011-09-14 LAB — COMPREHENSIVE METABOLIC PANEL
AST: 103 U/L — ABNORMAL HIGH (ref 0–37)
Alkaline Phosphatase: 52 U/L (ref 39–117)
CO2: 20 mEq/L (ref 19–32)
Chloride: 102 mEq/L (ref 96–112)
Creatinine, Ser: 1.96 mg/dL — ABNORMAL HIGH (ref 0.50–1.35)
GFR calc non Af Amer: 34 mL/min — ABNORMAL LOW (ref >90)
Total Bilirubin: 0.4 mg/dL (ref 0.3–1.2)

## 2011-09-14 LAB — CARDIAC PANEL(CRET KIN+CKTOT+MB+TROPI)
Relative Index: 0.3 (ref 0.0–2.5)
Relative Index: 0.2 (ref 0.0–2.5)
Relative Index: 0.2 (ref 0.0–2.5)
Total CK: 3080 U/L — ABNORMAL HIGH (ref 7–232)
Total CK: 3228 U/L — ABNORMAL HIGH (ref 7–232)
Troponin I: 0.31 ng/mL (ref <0.30)

## 2011-09-14 LAB — URINALYSIS, ROUTINE W REFLEX MICROSCOPIC
Nitrite: NEGATIVE
Protein, ur: 100 mg/dL — AB
Specific Gravity, Urine: 1.015 (ref 1.005–1.030)
Urobilinogen, UA: 0.2 mg/dL (ref 0.0–1.0)

## 2011-09-14 LAB — CBC
MCH: 28.2 pg (ref 26.0–34.0)
MCHC: 33.8 g/dL (ref 30.0–36.0)
Platelets: 121 10*3/uL — ABNORMAL LOW (ref 150–400)
RBC: 3.48 MIL/uL — ABNORMAL LOW (ref 4.22–5.81)
RDW: 14.8 % (ref 11.5–15.5)

## 2011-09-14 LAB — RETICULOCYTES
RBC.: 3.31 MIL/uL — ABNORMAL LOW (ref 4.22–5.81)
Retic Count, Absolute: 16.6 10*3/uL — ABNORMAL LOW (ref 19.0–186.0)
Retic Ct Pct: 0.5 % (ref 0.4–3.1)

## 2011-09-14 LAB — GLUCOSE, CAPILLARY
Glucose-Capillary: 94 mg/dL (ref 70–99)
Glucose-Capillary: 100 mg/dL — ABNORMAL HIGH (ref 70–99)
Glucose-Capillary: 113 mg/dL — ABNORMAL HIGH (ref 70–99)
Glucose-Capillary: 156 mg/dL — ABNORMAL HIGH (ref 70–99)

## 2011-09-14 LAB — BILIRUBIN, FRACTIONATED(TOT/DIR/INDIR)

## 2011-09-14 LAB — LACTATE DEHYDROGENASE: LDH: 496 U/L — ABNORMAL HIGH (ref 94–250)

## 2011-09-14 LAB — URINE MICROSCOPIC-ADD ON

## 2011-09-14 MED ORDER — NICOTINE 21 MG/24HR TD PT24
21.0000 mg | MEDICATED_PATCH | Freq: Every day | TRANSDERMAL | Status: DC
  Administered 2011-09-14 – 2011-09-16 (×3): 21 mg via TRANSDERMAL
  Filled 2011-09-14 (×3): qty 1

## 2011-09-14 MED ORDER — ACETAMINOPHEN 650 MG RE SUPP: 650.0000 mg | Freq: Four times a day (QID) | RECTAL | Status: DC | PRN

## 2011-09-14 MED ORDER — ASPIRIN EC 81 MG PO TBEC
81.0000 mg | DELAYED_RELEASE_TABLET | Freq: Every day | ORAL | Status: DC
  Administered 2011-09-14 – 2011-09-15 (×2): 81 mg via ORAL
  Filled 2011-09-14 (×3): qty 1

## 2011-09-14 MED ORDER — ALBUTEROL SULFATE (5 MG/ML) 0.5% IN NEBU
2.5000 mg | INHALATION_SOLUTION | Freq: Three times a day (TID) | RESPIRATORY_TRACT | Status: DC
  Administered 2011-09-15 (×2): 2.5 mg via RESPIRATORY_TRACT
  Filled 2011-09-14 (×2): qty 0.5

## 2011-09-14 MED ORDER — ENOXAPARIN SODIUM 40 MG/0.4ML ~~LOC~~ SOLN
40.0000 mg | SUBCUTANEOUS | Status: DC
  Administered 2011-09-14: 40 mg via SUBCUTANEOUS
  Filled 2011-09-14: qty 0.4

## 2011-09-14 MED ORDER — ALBUTEROL SULFATE (5 MG/ML) 0.5% IN NEBU: 2.5000 mg | INHALATION_SOLUTION | RESPIRATORY_TRACT | Status: DC | PRN

## 2011-09-14 MED ORDER — SODIUM CHLORIDE 0.9 % IJ SOLN
3.0000 mL | Freq: Two times a day (BID) | INTRAMUSCULAR | Status: DC
  Administered 2011-09-14 – 2011-09-16 (×3): 3 mL via INTRAVENOUS

## 2011-09-14 MED ORDER — ZOLPIDEM TARTRATE 5 MG PO TABS: 5.0000 mg | ORAL_TABLET | Freq: Every evening | ORAL | Status: DC | PRN

## 2011-09-14 MED ORDER — IPRATROPIUM BROMIDE 0.02 % IN SOLN
0.5000 mg | Freq: Three times a day (TID) | RESPIRATORY_TRACT | Status: DC
  Administered 2011-09-15 (×2): 0.5 mg via RESPIRATORY_TRACT
  Filled 2011-09-14 (×2): qty 2.5

## 2011-09-14 MED ORDER — MORPHINE SULFATE 2 MG/ML IJ SOLN: 2.0000 mg | INTRAMUSCULAR | Status: DC | PRN

## 2011-09-14 MED ORDER — ASPIRIN EC 81 MG PO TBEC: 81.0000 mg | DELAYED_RELEASE_TABLET | Freq: Every day | ORAL | Status: DC

## 2011-09-14 MED ORDER — SENNA 8.6 MG PO TABS
1.0000 | ORAL_TABLET | Freq: Two times a day (BID) | ORAL | Status: DC
  Administered 2011-09-14 – 2011-09-16 (×5): 8.6 mg via ORAL
  Filled 2011-09-14 (×7): qty 1

## 2011-09-14 MED ORDER — METFORMIN HCL 850 MG PO TABS
850.0000 mg | ORAL_TABLET | Freq: Every day | ORAL | Status: DC
  Filled 2011-09-14 (×2): qty 1

## 2011-09-14 MED ORDER — ONDANSETRON HCL 4 MG/2ML IJ SOLN: 4.0000 mg | Freq: Four times a day (QID) | INTRAMUSCULAR | Status: DC | PRN

## 2011-09-14 MED ORDER — ACETAMINOPHEN 325 MG PO TABS
650.0000 mg | ORAL_TABLET | Freq: Four times a day (QID) | ORAL | Status: DC | PRN
  Administered 2011-09-14: 650 mg via ORAL
  Filled 2011-09-14: qty 2

## 2011-09-14 MED ORDER — ALBUTEROL SULFATE (5 MG/ML) 0.5% IN NEBU
2.5000 mg | INHALATION_SOLUTION | Freq: Four times a day (QID) | RESPIRATORY_TRACT | Status: DC
  Administered 2011-09-14 (×3): 2.5 mg via RESPIRATORY_TRACT
  Filled 2011-09-14 (×4): qty 0.5

## 2011-09-14 MED ORDER — CLOPIDOGREL BISULFATE 75 MG PO TABS
75.0000 mg | ORAL_TABLET | Freq: Every day | ORAL | Status: DC
  Administered 2011-09-14 – 2011-09-16 (×3): 75 mg via ORAL
  Filled 2011-09-14 (×3): qty 1

## 2011-09-14 MED ORDER — POTASSIUM CHLORIDE IN NACL 40-0.9 MEQ/L-% IV SOLN
INTRAVENOUS | Status: DC
  Administered 2011-09-14 (×2): via INTRAVENOUS
  Filled 2011-09-14 (×3): qty 1000

## 2011-09-14 MED ORDER — FERROUS SULFATE 325 (65 FE) MG PO TABS
325.0000 mg | ORAL_TABLET | Freq: Every day | ORAL | Status: DC
  Administered 2011-09-14 – 2011-09-16 (×3): 325 mg via ORAL
  Filled 2011-09-14 (×4): qty 1

## 2011-09-14 MED ORDER — GUAIFENESIN ER 600 MG PO TB12
600.0000 mg | ORAL_TABLET | Freq: Two times a day (BID) | ORAL | Status: DC
  Administered 2011-09-14 – 2011-09-16 (×6): 600 mg via ORAL
  Filled 2011-09-14 (×7): qty 1

## 2011-09-14 MED ORDER — IRBESARTAN 300 MG PO TABS
300.0000 mg | ORAL_TABLET | ORAL | Status: DC
  Filled 2011-09-14: qty 1

## 2011-09-14 MED ORDER — PIOGLITAZONE HCL-METFORMIN HCL 15-850 MG PO TABS: 1.0000 | ORAL_TABLET | ORAL | Status: DC

## 2011-09-14 MED ORDER — ONDANSETRON HCL 4 MG PO TABS: 4.0000 mg | ORAL_TABLET | Freq: Four times a day (QID) | ORAL | Status: DC | PRN

## 2011-09-14 MED ORDER — SODIUM CHLORIDE 0.9 % IV SOLN
INTRAVENOUS | Status: DC
  Administered 2011-09-14 – 2011-09-15 (×3): via INTRAVENOUS

## 2011-09-14 MED ORDER — IPRATROPIUM BROMIDE 0.02 % IN SOLN
0.5000 mg | Freq: Four times a day (QID) | RESPIRATORY_TRACT | Status: DC
  Administered 2011-09-14 (×3): 0.5 mg via RESPIRATORY_TRACT
  Filled 2011-09-14 (×4): qty 2.5

## 2011-09-14 MED ORDER — INSULIN ASPART 100 UNIT/ML ~~LOC~~ SOLN: 0.0000 [IU] | Freq: Three times a day (TID) | SUBCUTANEOUS | Status: DC

## 2011-09-14 MED ORDER — PIOGLITAZONE HCL 15 MG PO TABS
15.0000 mg | ORAL_TABLET | Freq: Every day | ORAL | Status: DC
  Administered 2011-09-14 – 2011-09-16 (×3): 15 mg via ORAL
  Filled 2011-09-14 (×4): qty 1

## 2011-09-14 MED ORDER — BIOTENE DRY MOUTH MT LIQD
15.0000 mL | Freq: Two times a day (BID) | OROMUCOSAL | Status: DC
  Administered 2011-09-14 – 2011-09-16 (×4): 15 mL via OROMUCOSAL

## 2011-09-14 NOTE — Progress Notes (Signed)
UR Completed Sabri Teal Graves-Bigelow, RN,BSN [phone]  

## 2011-09-14 NOTE — Progress Notes (Signed)
CSW attempted to assess patient for advanced directives. However pt currently in with rn. CSW will follow up later today if time allows.   Catha Gosselin, Theresia Majors  [phone] .09/14/2011 15:13pm

## 2011-09-14 NOTE — Progress Notes (Signed)
CRITICAL VALUE ALERT  Critical value received:  Troponin 0.44, CKMB 9.3  Date of notification:  09/14/11  Time of notification:  0236  Critical value read back:yes  Nurse who received alert:  Annabell Howells, RN, PCCN  MD notified (1st page):  Maren Reamer, NP  Time of first page:  0240  MD notified (2nd page):  Time of second page:  Responding MD:  Maren Reamer, NP  Time MD responded:  [phone]

## 2011-09-14 NOTE — H&P (Signed)
Aaron Kerr is an 66 y.o. male.   Chief Complaint: Shortness of breath and weakness HPI: A 66 year old gentleman with known history of diabetes hypertension and COPD who is a smoker that presented to [facility] today with generalized weakness as well as shortness of breath. Patient's problems started 3 days ago with nausea and diarrhea. He's had at least 5 bowel movements everyday that is watery. Today however he started feeling shortness of breath flulike symptoms and cough. He has some fever and mild chills. His 2 grandchildren have apparently been sick and he has been with them. He was weak at home and almost passed out the family had to hold him and stop him from falling down. He was hypotensive in the emergency room with systolic blood pressure of 70. His affect is improved with fluid resuscitation. He seemed to be back to his baseline however his chest x-ray showed evidence of pneumonia.  Past Medical History  Diagnosis Date  . Anemia   . Hypertension   . Pneumonia 09/13/11    "first time"  . Exertional dyspnea   . Sleep apnea 09/14/11    "wear breath rite strips"  . Type II diabetes mellitus   . GERD (gastroesophageal reflux disease)   . Syncope and collapse 09/13/11    "to weak to bear his own weight"  . Kidney stones   . Arthritis     "left hip"    Past Surgical History  Procedure Date  . Tonsillectomy     "as a child"  . Cardiac catheterization   . Carotid endarterectomy 05/15/2011    right  . Eye surgery   . Cataract extraction w/ intraocular lens  implant, bilateral 03/2011    History reviewed. No pertinent family history. Social History:  reports that he has been smoking Cigarettes.  He has a 37.5 pack-year smoking history. He does not have any smokeless tobacco history on file. He reports that he drinks alcohol. He reports that he does not use illicit drugs.  Allergies:  Allergies  Allergen Reactions  . Meloxicam Swelling    Ankles became swollen     Medications Prior to Admission  Medication Sig Dispense Refill  . aspirin EC 81 MG tablet Take 81 mg by mouth at bedtime.        . clopidogrel (PLAVIX) 75 MG tablet Take 75 mg by mouth.        . ferrous sulfate (IRON SUPPLEMENT) 325 (65 FE) MG tablet Take 325 mg by mouth daily with breakfast.        . irbesartan (AVAPRO) 300 MG tablet Take 300 mg by mouth every morning.        . pioglitazone-metformin (ACTOPLUS MET) 15-850 MG per tablet Take 1 tablet by mouth every morning.          Results for orders placed during the hospital encounter of 09/13/11 (from the past 48 hour(s))  CBC     Status: Abnormal   Collection Time   09/13/11  6:20 PM      Component Value Range Comment   WBC 10.5  4.0 - 10.5 (K/uL)    RBC 3.72 (*) 4.22 - 5.81 (MIL/uL)    Hemoglobin 11.0 (*) 13.0 - 17.0 (g/dL)    HCT 96.0 (*) 45.4 - 52.0 (%)    MCV 83.1  78.0 - 100.0 (fL)    MCH 29.6  26.0 - 34.0 (pg)    MCHC 35.6  30.0 - 36.0 (g/dL)    RDW 09.8  11.5 - 15.5 (%)    Platelets 125 (*) 150 - 400 (K/uL)   DIFFERENTIAL     Status: Abnormal   Collection Time   09/13/11  6:20 PM      Component Value Range Comment   Neutrophils Relative 93 (*) 43 - 77 (%)    Neutro Abs 9.7 (*) 1.7 - 7.7 (K/uL)    Lymphocytes Relative 3 (*) 12 - 46 (%)    Lymphs Abs 0.3 (*) 0.7 - 4.0 (K/uL)    Monocytes Relative 4  3 - 12 (%)    Monocytes Absolute 0.4  0.1 - 1.0 (K/uL)    Eosinophils Relative 0  0 - 5 (%)    Eosinophils Absolute 0.0  0.0 - 0.7 (K/uL)    Basophils Relative 0  0 - 1 (%)    Basophils Absolute 0.0  0.0 - 0.1 (K/uL)   COMPREHENSIVE METABOLIC PANEL     Status: Abnormal   Collection Time   09/13/11  6:20 PM      Component Value Range Comment   Sodium 127 (*) 135 - 145 (mEq/L)    Potassium 4.2  3.5 - 5.1 (mEq/L)    Chloride 93 (*) 96 - 112 (mEq/L)    CO2 21  19 - 32 (mEq/L)    Glucose, Bld 109 (*) 70 - 99 (mg/dL)    BUN 43 (*) 6 - 23 (mg/dL)    Creatinine, Ser 8.46 (*) 0.50 - 1.35 (mg/dL)    Calcium 8.4  8.4 - 10.5  (mg/dL)    Total Protein 6.6  6.0 - 8.3 (g/dL)    Albumin 3.1 (*) 3.5 - 5.2 (g/dL)    AST 962 (*) 0 - 37 (U/L)    ALT 37  0 - 53 (U/L)    Alkaline Phosphatase 48  39 - 117 (U/L)    Total Bilirubin 0.7  0.3 - 1.2 (mg/dL)    GFR calc non Af Amer 33 (*) >90 (mL/min)    GFR calc Af Amer 39 (*) >90 (mL/min)   LACTIC ACID, PLASMA     Status: Normal   Collection Time   09/13/11  6:27 PM      Component Value Range Comment   Lactic Acid, Venous 1.1  0.5 - 2.2 (mmol/L)   URINALYSIS, ROUTINE W REFLEX MICROSCOPIC     Status: Abnormal   Collection Time   09/14/11 12:36 AM      Component Value Range Comment   Color, Urine YELLOW  YELLOW     APPearance CLOUDY (*) CLEAR     Specific Gravity, Urine 1.015  1.005 - 1.030     pH 5.0  5.0 - 8.0     Glucose, UA NEGATIVE  NEGATIVE (mg/dL)    Hgb urine dipstick LARGE (*) NEGATIVE     Bilirubin Urine NEGATIVE  NEGATIVE     Ketones, ur NEGATIVE  NEGATIVE (mg/dL)    Protein, ur 952 (*) NEGATIVE (mg/dL)    Urobilinogen, UA 0.2  0.0 - 1.0 (mg/dL)    Nitrite NEGATIVE  NEGATIVE     Leukocytes, UA NEGATIVE  NEGATIVE    URINE MICROSCOPIC-ADD ON     Status: Abnormal   Collection Time   09/14/11 12:36 AM      Component Value Range Comment   Squamous Epithelial / LPF RARE  RARE     WBC, UA 3-6  <3 (WBC/hpf)    RBC / HPF 7-10  <3 (RBC/hpf)    Bacteria, UA MANY (*) RARE  Casts GRANULAR CAST (*) NEGATIVE    CARDIAC PANEL(CRET KIN+CKTOT+MB+TROPI)     Status: Abnormal   Collection Time   09/14/11  1:22 AM      Component Value Range Comment   Total CK 3080 (*) 7 - 232 (U/L)    CK, MB 9.3 (*) 0.3 - 4.0 (ng/mL)    Troponin I 0.44 (*) <0.30 (ng/mL)    Relative Index 0.3  0.0 - 2.5     Dg Chest 2 View  09/13/2011  *RADIOLOGY REPORT*  Clinical Data: Weakness for few days.  Cough.  Shortness of breath. Fever.  Diabetes.  Hypertension.  Smoker.  CHEST - 2 VIEW  Comparison: 05/11/2011  Findings: AP and lateral views.  Mild hyperinflation.  Midline trachea.  Mild  cardiomegaly. Mediastinal contours otherwise within normal limits.  No pleural effusion or pneumothorax.  Diffuse peribronchial thickening.  Increased density projecting over the left heart border and left lung base may correspond to increased posterior density on the lateral.  This area is suboptimally evaluated due to artifact on the lateral.  IMPRESSION:  1.  COPD/chronic bronchitis, mild. 2.  Cannot exclude left lower lobe airspace disease/pneumonia. Possible increased density on frontal corresponding to increased posterior density on lateral.  Of note, lateral view is degraded due to overlying artifact.  Presuming left lower lobe pneumonia is a clinical concern, potential clinical strategies would include antibiotic therapy and short-term follow-up versus full PA and lateral views without artifact.  Original Report Authenticated By: Consuello Bossier, M.D.    Review of Systems  Constitutional: Positive for fever, chills and malaise/fatigue.  HENT: Negative.   Eyes: Negative.   Respiratory: Positive for cough, sputum production and shortness of breath. Negative for hemoptysis and wheezing.   Cardiovascular: Negative.   Gastrointestinal: Positive for nausea, abdominal pain and diarrhea.  Genitourinary: Negative.   Musculoskeletal: Negative.   Skin: Negative.   Neurological: Positive for dizziness, loss of consciousness and weakness.  Endo/Heme/Allergies: Negative.   Psychiatric/Behavioral: Negative.     Blood pressure 120/65, pulse 87, temperature 98.4 F (36.9 C), temperature source Oral, resp. rate 20, height 5\' 8"  (1.727 m), weight 86.4 kg (190 lb 7.6 oz), SpO2 98.00%. Physical Exam  Constitutional: He is oriented to person, place, and time. He appears well-developed and well-nourished.  HENT:  Head: Normocephalic and atraumatic.  Right Ear: External ear normal.  Left Ear: External ear normal.  Mouth/Throat: Oropharynx is clear and moist.  Eyes: Conjunctivae and EOM are normal. Pupils  are equal, round, and reactive to light.  Neck: Normal range of motion. Neck supple.  Cardiovascular: Normal rate, regular rhythm, normal heart sounds and intact distal pulses.   Respiratory: Effort normal. No respiratory distress. He has no wheezes. He has rales. He exhibits no tenderness.  GI: Soft. Bowel sounds are normal.  Musculoskeletal: Normal range of motion.  Neurological: He is alert and oriented to person, place, and time. He has normal reflexes.  Skin: Skin is warm and dry.  Psychiatric: He has a normal mood and affect. His behavior is normal. Judgment and thought content normal.     Assessment/Plan Assessment this is a 66 year old gentleman with community-acquired pneumonia near syncope dehydration acute renal insufficiency as well as hypotension. Patient is also diabetic. Plan #1 community-acquired pneumonia: Patient will be admitted started on IV antibiotics. Will be monitored closely for resolution of his symptoms. We will get sputum cultures and blood cultures and continue his Rocephin. #2 acute renal failure: Probably secondary to his diarrhea and dehydration  we will aggressively hydrate him and follow renal function. #3 diabetes: Hold the metformin but continue with his Actos and sliding scale insulin #4 hyponatremia: Most likely from his dehydration. We will again hydrate him aggressively #5 COPD: Patient is not wheezing at the moment we will continue his empiric nebulizers #6 tobacco abuse: Will give him nicotine patch and tobacco cessation counseling #7 carotid artery disease: He had stenting earlier this year. He seemed to be stable even his near syncopal episode was probably due to dehydration.  Tiffanye Hartmann,LAWAL 09/14/2011, 5:36 AM

## 2011-09-14 NOTE — Care Management Note (Unsigned)
    Page 1 of 1   09/14/2011     1:12:37 PM   CARE MANAGEMENT NOTE 09/14/2011  Patient:  Aaron Kerr, Aaron Kerr   Account Number:  [id number]  Date Initiated:  09/14/2011  Documentation initiated by:  GRAVES-BIGELOW,Dvon Jiles  Subjective/Objective Assessment:   Pt admitted with CAP. Pt is from home with family. On IV rocephin.     Action/Plan:   Anticipated DC Date:  09/17/2011   Anticipated DC Plan:        DC Planning Services  CM consult      Choice offered to / List presented to:             Status of service:  In process, will continue to follow Medicare Important Message given?   (If response is "NO", the following Medicare IM given date fields will be blank) Date Medicare IM given:   Date Additional Medicare IM given:    Discharge Disposition:    Per UR Regulation:    If discussed at Long Length of Stay Meetings, dates discussed:    Comments:  09-14-11 1311 Tomi Bamberger, RN,BSN [phone] CM will conitnue to monitor for disposition needs.

## 2011-09-14 NOTE — Progress Notes (Addendum)
Subjective: H&P reviewed, patient was admitted this morning for generalized weakness, presyncope and was found to have hypotension with systolic blood pressure in the 70s, chest x-ray showed finding suggestive of possible pneumonia. Labs showed acute renal failure. Patient seen and examined, denies any shortness of breath, chest pain or dizziness and stated that he is feeling a lot better. Patient stated that he never felt dizzy however his left knee gave away and he was about to fall down yesterday. He is complaining of productive occasional cough. Was unable to specify the color of sputum.  Objective: Vital signs in last 24 hours: Temp:  [98.3 F (36.8 C)-100.8 F (38.2 C)] 98.6 F (37 C) (05/10 1055) Pulse Rate:  [80-107] 95  (05/10 1055) Resp:  [16-20] 20  (05/10 1055) BP: (71-120)/(36-65) 95/50 mmHg (05/10 1055) SpO2:  [91 %-99 %] 97 % (05/10 1055) Weight:  [83.008 kg (183 lb)-86.4 kg (190 lb 7.6 oz)] 86.4 kg (190 lb 7.6 oz) (05/09 2245) Weight change:  Last BM Date: 09/13/11  Intake/Output from previous day:       Physical Exam: General: Alert, awake, oriented x3, in no acute distress. Heart: Regular rate and rhythm, without murmurs, rubs, gallops. Lungs: Clear to auscultation bilaterally. Abdomen: Soft, nontender, nondistended, positive bowel sounds. Extremities: No clubbing cyanosis or edema with positive pedal pulses. Neuro: Grossly intact, nonfocal.    Lab Results: Results for orders placed during the hospital encounter of 09/13/11 (from the past 24 hour(s))  CBC     Status: Abnormal   Collection Time   09/13/11  6:20 PM      Component Value Range   WBC 10.5  4.0 - 10.5 (K/uL)   RBC 3.72 (*) 4.22 - 5.81 (MIL/uL)   Hemoglobin 11.0 (*) 13.0 - 17.0 (g/dL)   HCT 16.1 (*) 09.6 - 52.0 (%)   MCV 83.1  78.0 - 100.0 (fL)   MCH 29.6  26.0 - 34.0 (pg)   MCHC 35.6  30.0 - 36.0 (g/dL)   RDW 04.5  40.9 - 81.1 (%)   Platelets 125 (*) 150 - 400 (K/uL)  DIFFERENTIAL      Status: Abnormal   Collection Time   09/13/11  6:20 PM      Component Value Range   Neutrophils Relative 93 (*) 43 - 77 (%)   Neutro Abs 9.7 (*) 1.7 - 7.7 (K/uL)   Lymphocytes Relative 3 (*) 12 - 46 (%)   Lymphs Abs 0.3 (*) 0.7 - 4.0 (K/uL)   Monocytes Relative 4  3 - 12 (%)   Monocytes Absolute 0.4  0.1 - 1.0 (K/uL)   Eosinophils Relative 0  0 - 5 (%)   Eosinophils Absolute 0.0  0.0 - 0.7 (K/uL)   Basophils Relative 0  0 - 1 (%)   Basophils Absolute 0.0  0.0 - 0.1 (K/uL)  COMPREHENSIVE METABOLIC PANEL     Status: Abnormal   Collection Time   09/13/11  6:20 PM      Component Value Range   Sodium 127 (*) 135 - 145 (mEq/L)   Potassium 4.2  3.5 - 5.1 (mEq/L)   Chloride 93 (*) 96 - 112 (mEq/L)   CO2 21  19 - 32 (mEq/L)   Glucose, Bld 109 (*) 70 - 99 (mg/dL)   BUN 43 (*) 6 - 23 (mg/dL)   Creatinine, Ser 9.14 (*) 0.50 - 1.35 (mg/dL)   Calcium 8.4  8.4 - 78.2 (mg/dL)   Total Protein 6.6  6.0 - 8.3 (g/dL)  Albumin 3.1 (*) 3.5 - 5.2 (g/dL)   AST 161 (*) 0 - 37 (U/L)   ALT 37  0 - 53 (U/L)   Alkaline Phosphatase 48  39 - 117 (U/L)   Total Bilirubin 0.7  0.3 - 1.2 (mg/dL)   GFR calc non Af Amer 33 (*) >90 (mL/min)   GFR calc Af Amer 39 (*) >90 (mL/min)  LACTIC ACID, PLASMA     Status: Normal   Collection Time   09/13/11  6:27 PM      Component Value Range   Lactic Acid, Venous 1.1  0.5 - 2.2 (mmol/L)  GLUCOSE, CAPILLARY     Status: Normal   Collection Time   09/13/11 10:46 PM      Component Value Range   Glucose-Capillary 94  70 - 99 (mg/dL)   Comment 1 Notify RN    HEMOGLOBIN A1C     Status: Abnormal   Collection Time   09/14/11 12:18 AM      Component Value Range   Hemoglobin A1C 6.6 (*) <5.7 (%)   Mean Plasma Glucose 143 (*) <117 (mg/dL)  URINALYSIS, ROUTINE W REFLEX MICROSCOPIC     Status: Abnormal   Collection Time   09/14/11 12:36 AM      Component Value Range   Color, Urine YELLOW  YELLOW    APPearance CLOUDY (*) CLEAR    Specific Gravity, Urine 1.015  1.005 - 1.030    pH  5.0  5.0 - 8.0    Glucose, UA NEGATIVE  NEGATIVE (mg/dL)   Hgb urine dipstick LARGE (*) NEGATIVE    Bilirubin Urine NEGATIVE  NEGATIVE    Ketones, ur NEGATIVE  NEGATIVE (mg/dL)   Protein, ur 096 (*) NEGATIVE (mg/dL)   Urobilinogen, UA 0.2  0.0 - 1.0 (mg/dL)   Nitrite NEGATIVE  NEGATIVE    Leukocytes, UA NEGATIVE  NEGATIVE   URINE MICROSCOPIC-ADD ON     Status: Abnormal   Collection Time   09/14/11 12:36 AM      Component Value Range   Squamous Epithelial / LPF RARE  RARE    WBC, UA 3-6  <3 (WBC/hpf)   RBC / HPF 7-10  <3 (RBC/hpf)   Bacteria, UA MANY (*) RARE    Casts GRANULAR CAST (*) NEGATIVE   CARDIAC PANEL(CRET KIN+CKTOT+MB+TROPI)     Status: Abnormal   Collection Time   09/14/11  1:22 AM      Component Value Range   Total CK 3080 (*) 7 - 232 (U/L)   CK, MB 9.3 (*) 0.3 - 4.0 (ng/mL)   Troponin I 0.44 (*) <0.30 (ng/mL)   Relative Index 0.3  0.0 - 2.5   COMPREHENSIVE METABOLIC PANEL     Status: Abnormal   Collection Time   09/14/11  6:10 AM      Component Value Range   Sodium 133 (*) 135 - 145 (mEq/L)   Potassium 4.7  3.5 - 5.1 (mEq/L)   Chloride 102  96 - 112 (mEq/L)   CO2 20  19 - 32 (mEq/L)   Glucose, Bld 88  70 - 99 (mg/dL)   BUN 43 (*) 6 - 23 (mg/dL)   Creatinine, Ser 0.45 (*) 0.50 - 1.35 (mg/dL)   Calcium 7.8 (*) 8.4 - 10.5 (mg/dL)   Total Protein 5.5 (*) 6.0 - 8.3 (g/dL)   Albumin 2.5 (*) 3.5 - 5.2 (g/dL)   AST 409 (*) 0 - 37 (U/L)   ALT 42  0 - 53 (U/L)   Alkaline  Phosphatase 52  39 - 117 (U/L)   Total Bilirubin 0.4  0.3 - 1.2 (mg/dL)   GFR calc non Af Amer 34 (*) >90 (mL/min)   GFR calc Af Amer 40 (*) >90 (mL/min)  CBC     Status: Abnormal   Collection Time   09/14/11  6:10 AM      Component Value Range   WBC 8.0  4.0 - 10.5 (K/uL)   RBC 3.48 (*) 4.22 - 5.81 (MIL/uL)   Hemoglobin 9.8 (*) 13.0 - 17.0 (g/dL)   HCT 78.2 (*) 95.6 - 52.0 (%)   MCV 83.3  78.0 - 100.0 (fL)   MCH 28.2  26.0 - 34.0 (pg)   MCHC 33.8  30.0 - 36.0 (g/dL)   RDW 21.3  08.6 - 57.8 (%)     Platelets 121 (*) 150 - 400 (K/uL)  GLUCOSE, CAPILLARY     Status: Abnormal   Collection Time   09/14/11  8:08 AM      Component Value Range   Glucose-Capillary 100 (*) 70 - 99 (mg/dL)   Comment 1 Notify RN    CARDIAC PANEL(CRET KIN+CKTOT+MB+TROPI)     Status: Abnormal   Collection Time   09/14/11  9:03 AM      Component Value Range   Total CK 3228 (*) 7 - 232 (U/L)   CK, MB 8.0 (*) 0.3 - 4.0 (ng/mL)   Troponin I 0.31 (*) <0.30 (ng/mL)   Relative Index 0.2  0.0 - 2.5     Studies/Results: Dg Chest 2 View  09/13/2011  *RADIOLOGY REPORT*  Clinical Data: Weakness for few days.  Cough.  Shortness of breath. Fever.  Diabetes.  Hypertension.  Smoker.  CHEST - 2 VIEW  Comparison: 05/11/2011  Findings: AP and lateral views.  Mild hyperinflation.  Midline trachea.  Mild cardiomegaly. Mediastinal contours otherwise within normal limits.  No pleural effusion or pneumothorax.  Diffuse peribronchial thickening.  Increased density projecting over the left heart border and left lung base may correspond to increased posterior density on the lateral.  This area is suboptimally evaluated due to artifact on the lateral.  IMPRESSION:  1.  COPD/chronic bronchitis, mild. 2.  Cannot exclude left lower lobe airspace disease/pneumonia. Possible increased density on frontal corresponding to increased posterior density on lateral.  Of note, lateral view is degraded due to overlying artifact.  Presuming left lower lobe pneumonia is a clinical concern, potential clinical strategies would include antibiotic therapy and short-term follow-up versus full PA and lateral views without artifact.  Original Report Authenticated By: Consuello Bossier, M.D.    Medications:    . sodium chloride   Intravenous Once  . acetaminophen  650 mg Oral Once  . albuterol  2.5 mg Nebulization Q6H  . antiseptic oral rinse  15 mL Mouth Rinse BID  . aspirin EC  81 mg Oral QHS  . azithromycin  500 mg Intravenous Q24H  . cefTRIAXone (ROCEPHIN)   IV  1 g Intravenous Q24H  . clopidogrel  75 mg Oral Q breakfast  . enoxaparin  40 mg Subcutaneous Q24H  . ferrous sulfate  325 mg Oral Q breakfast  . guaiFENesin  600 mg Oral BID  . insulin aspart  0-9 Units Subcutaneous TID WC  . ipratropium  0.5 mg Nebulization Q6H  . nicotine  21 mg Transdermal Daily  . ondansetron      . pioglitazone  15 mg Oral QAC breakfast  . senna  1 tablet Oral BID  . sodium chloride  3 mL Intravenous Q12H  . DISCONTD: aspirin EC  81 mg Oral Daily  . DISCONTD: irbesartan  300 mg Oral BH-q7a  . DISCONTD: metFORMIN  850 mg Oral Q breakfast  . DISCONTD: pioglitazone-metformin  1 tablet Oral BH-q7a    acetaminophen, acetaminophen, albuterol, morphine injection, ondansetron (ZOFRAN) IV, ondansetron, zolpidem     . 0.9 % NaCl with KCl 40 mEq / L 125 mL/hr at 09/14/11 0248    Assessment/Plan:  Active Problems:  Community acquired pneumonia/fever: Continue Zithromax and Rocephin. Lactate 1.1, patient spiked temperature of 102, will send blood cultures x2. Urine culture is pending Elevated cardiac enzymes: Possibly secondary to demand ischemia, patient is chest pain-free. We ordered a stat EKG. Patient was previously followed by [facility] , I consulted them to evaluate the patient today. Continue aspirin and Plavix  Dehydration/ Hyponatremia Continue IV fluids with normal saline, discontinue IV potassium (K. level is 4.7). Hyponatremia is mild and improving with normal saline.  ARF (acute renal failure) : Most likely secondary to dehydration/prerenal azotemia, however ATN secondary to hypotensive episodes cannot be ruled out. slowly improving with IV fluid to continue to monitor urine output. I held Avapro and metformin Diarrhea : send  for c diff PCR Diabetes mellitus 2: Holding metformin as above, continue with Actos and SSI  hypotension/history of hypertension: Hold the Avapro as above, continue with IV fluids, follow blood cultures and urine cultures. Check  orthostatic vitals  Anemia: Acute on chronic, check stool for blood and anemia panel Thrombocytopenia: Acute possibly secondary to DIC precipitated by sepsis versus TTP which is unlikely (patient has no neurological symptoms), follow cultures.  check peripheral smear to R/O schistocytes , LDH, and indirect bilirubin, PT, PTT, reticulocyte count. DC Lovenox and switch to sequential compression devices for DVT prophylaxis.  Carotid stenosis status post stent ; continue Plavix  COPD (chronic obstructive pulmonary disease): Continue nebs when necessary *PT consult     LOS: 1 day   Nataly Pacifico 09/14/2011, 11:01 AM

## 2011-09-14 NOTE — Evaluation (Signed)
Physical Therapy Evaluation Patient Details Name: Aaron Kerr MRN: [id number] DOB: [birth year] Today's Date: 09/14/2011 Time: 1027-2536 PT Time Calculation (min): 40 min  PT Assessment / Plan / Recommendation Clinical Impression  Pt with CAP and will benefit from acute therapy to maximize mobility, gait and independence prior to discharge to return pt to PLOF. Pt encouraged to continue mobility with staff and perform HEP as he feels able. Pt perspiring today due to fever and generally fatigued but eager to mobilize and reported feeling better after multiple sit to stand trials and in room ambulation.     PT Assessment  Patient needs continued PT services    Follow Up Recommendations  No PT follow up    Barriers to Discharge None      lEquipment Recommendations  None recommended by PT    Recommendations for Other Services     Frequency Min 3X/week    Precautions / Restrictions     Pertinent Vitals/Pain No pain sats 88-97 throughout activity on 2L O2 HR 102 with activity      Mobility  Bed Mobility Bed Mobility: Rolling Right;Right Sidelying to Sit;Sitting - Scoot to Edge of Bed Rolling Right: 6: Modified independent (Device/Increase time) Right Sidelying to Sit: 6: Modified independent (Device/Increase time) Sitting - Scoot to Edge of Bed: 6: Modified independent (Device/Increase time) Transfers Transfers: Sit to Stand;Stand to [employer] Transfers Sit to Stand: 5: Supervision;From bed;From chair/3-in-1 Stand to Sit: 5: Supervision;To chair/3-in-1 Stand Pivot Transfers: 5: Supervision Details for Transfer Assistance: cueing for hand placement and safety with sequence and RW use bed to chair for pivot Ambulation/Gait Ambulation/Gait Assistance: 5: Supervision Ambulation Distance (Feet): 20 Feet Assistive device: Rolling walker Ambulation/Gait Assistance Details: supervision for lines Gait Pattern: Step-through pattern;Decreased stride length Stairs: No      Exercises     PT Diagnosis: Difficulty walking  PT Problem List: Decreased activity tolerance PT Treatment Interventions: Gait training;Stair training;Functional mobility training;Therapeutic activities;Patient/family education   PT Goals Acute Rehab PT Goals PT Goal Formulation: With patient Time For Goal Achievement: 09/21/11 Potential to Achieve Goals: Good Pt will go Supine/Side to Sit: Independently PT Goal: Supine/Side to Sit - Progress: Goal set today Pt will go Sit to Supine/Side: Independently PT Goal: Sit to Supine/Side - Progress: Goal set today Pt will go Sit to Stand: Independently PT Goal: Sit to Stand - Progress: Goal set today Pt will go Stand to Sit: Independently PT Goal: Stand to Sit - Progress: Goal set today Pt will Ambulate: with modified independence;with least restrictive assistive device;>150 feet PT Goal: Ambulate - Progress: Goal set today Pt will Go Up / Down Stairs: 3-5 stairs;with supervision PT Goal: Up/Down Stairs - Progress: Goal set today  Visit Information  Last PT Received On: 09/14/11 Assistance Needed: +1    Subjective Data  Subjective: I am just so weak Patient Stated Goal: return to work and taking care of myself   Prior Functioning  Home Living Lives With: Spouse Type of Home: House Home Access: Stairs to enter Secretary/administrator of Steps: 3 Home Layout: One level Bathroom Shower/Tub: Health visitor: Standard Home Adaptive Equipment: Bedside commode/3-in-1;Walker - rolling;Straight cane;Shower chair with back Prior Function Level of Independence: Independent Able to Take Stairs?: Yes Driving: Yes Vocation: Full time employment Communication Communication: No difficulties    Cognition  Overall Cognitive Status: Appears within functional limits for tasks assessed/performed Arousal/Alertness: Awake/alert Orientation Level: Appears intact for tasks assessed Behavior During Session: [facility] for tasks performed  Extremity/Trunk Assessment Right Upper Extremity Assessment RUE ROM/Strength/Tone: Within functional levels Left Upper Extremity Assessment LUE ROM/Strength/Tone: Within functional levels Right Lower Extremity Assessment RLE ROM/Strength/Tone: Within functional levels Left Lower Extremity Assessment LLE ROM/Strength/Tone: Within functional levels   Balance    End of Session PT - End of Session Equipment Utilized During Treatment: Gait belt Activity Tolerance: Patient tolerated treatment well Patient left: in chair;with call bell/phone within reach;with family/visitor present   Delorse Lek 09/14/2011, 3:33 PM  Delaney Meigs, PT [phone]

## 2011-09-14 NOTE — Consult Note (Signed)
Reason for Consult:elevated troponin Referring Physician:   WINTHROP Aaron Kerr is an 66 y.o. male.  HPI:   The patient is a 66 year old Caucasian male with a history of a 50-pack-year tobacco abuse, continues to smoke one pack per day. He uses a history of hypertension, diabetes mellitus coronary artery disease high-grade right ICA stenosis which was stented by Dr. Allyson Kerr with a nitinol self expanding stent in January of this year.  followup carotid Dopplers were performed on January 29 and revealed a widely patent right internal carotid artery.  He also had a left heart catheterization on 05/15/2011 which revealed minimal CAD with at most 30% lesion in the proximal LAD and RCA. His family history is positive for heart disease in his father 15 and his brother died of MI at age 57. Patient also has a problem with his left hip fracture by Dr. Fredric Kerr. Patient does take aspirin and Plavix and has been consistent with both those medications.  Patient states he began feeling sick last Sunday with a runny nose. It has been getting progressively worse throughout the week.  He developed diarrhea approximately 2-3 days ago.  He's had a fever. He also reports being extremely weak with loss of balance. He's fallen to the floor on 2 occasions has remained conscious the entire time.  States he developed slurred speech yesterday which improved considerably by 2100 hours last night after being admitted.  He Also reports a sense of urgency with urination and on occasion has not been able to get his pants down quick enough.  He denies chest pain, shortness of breath, nausea, vomiting, palpitations, vision changes, lower extremity edema, cough, congestion, abdominal pain.  We are asked to see the patient due to [place] slightly elevated troponin 0.44. Subsequent check should decrease to 0.31. Also upon presenting the patient was hypotensive. Elevated heart rate.  Past Medical History  Diagnosis Date  . Anemia   . Hypertension   .  Pneumonia 09/13/11    "first time"  . Exertional dyspnea   . Sleep apnea 09/14/11    "wear breath rite strips"  . Type II diabetes mellitus   . GERD (gastroesophageal reflux disease)   . Syncope and collapse 09/13/11    "to weak to bear his own weight"  . Kidney stones   . Arthritis     "left hip"    Past Surgical History  Procedure Date  . Tonsillectomy     "as a child"  . Cardiac catheterization   . Carotid endarterectomy 05/15/2011    right  . Eye surgery   . Cataract extraction w/ intraocular lens  implant, bilateral 03/2011    History reviewed. No pertinent family history.  Social History:  reports that he has been smoking Cigarettes.  He has a 37.5 pack-year smoking history. He does not have any smokeless tobacco history on file. He reports that he drinks alcohol. He reports that he does not use illicit drugs.  Allergies:  Allergies  Allergen Reactions  . Meloxicam Swelling    Ankles became swollen    Medications:  Pioglitazone/metformin 15/850 mg, iron 65 mg, irbesartan 300 mg, Plavix 75 mg, aspirin 81   Results for orders placed during the hospital encounter of 09/13/11 (from the past 48 hour(s))  CBC     Status: Abnormal   Collection Time   09/13/11  6:20 PM      Component Value Range Comment   WBC 10.5  4.0 - 10.5 (K/uL)    RBC 3.72 (*)  4.22 - 5.81 (MIL/uL)    Hemoglobin 11.0 (*) 13.0 - 17.0 (g/dL)    HCT 16.1 (*) 09.6 - 52.0 (%)    MCV 83.1  78.0 - 100.0 (fL)    MCH 29.6  26.0 - 34.0 (pg)    MCHC 35.6  30.0 - 36.0 (g/dL)    RDW 04.5  40.9 - 81.1 (%)    Platelets 125 (*) 150 - 400 (K/uL)   DIFFERENTIAL     Status: Abnormal   Collection Time   09/13/11  6:20 PM      Component Value Range Comment   Neutrophils Relative 93 (*) 43 - 77 (%)    Neutro Abs 9.7 (*) 1.7 - 7.7 (K/uL)    Lymphocytes Relative 3 (*) 12 - 46 (%)    Lymphs Abs 0.3 (*) 0.7 - 4.0 (K/uL)    Monocytes Relative 4  3 - 12 (%)    Monocytes Absolute 0.4  0.1 - 1.0 (K/uL)    Eosinophils Relative  0  0 - 5 (%)    Eosinophils Absolute 0.0  0.0 - 0.7 (K/uL)    Basophils Relative 0  0 - 1 (%)    Basophils Absolute 0.0  0.0 - 0.1 (K/uL)   COMPREHENSIVE METABOLIC PANEL     Status: Abnormal   Collection Time   09/13/11  6:20 PM      Component Value Range Comment   Sodium 127 (*) 135 - 145 (mEq/L)    Potassium 4.2  3.5 - 5.1 (mEq/L)    Chloride 93 (*) 96 - 112 (mEq/L)    CO2 21  19 - 32 (mEq/L)    Glucose, Bld 109 (*) 70 - 99 (mg/dL)    BUN 43 (*) 6 - 23 (mg/dL)    Creatinine, Ser 9.14 (*) 0.50 - 1.35 (mg/dL)    Calcium 8.4  8.4 - 10.5 (mg/dL)    Total Protein 6.6  6.0 - 8.3 (g/dL)    Albumin 3.1 (*) 3.5 - 5.2 (g/dL)    AST 782 (*) 0 - 37 (U/L)    ALT 37  0 - 53 (U/L)    Alkaline Phosphatase 48  39 - 117 (U/L)    Total Bilirubin 0.7  0.3 - 1.2 (mg/dL)    GFR calc non Af Amer 33 (*) >90 (mL/min)    GFR calc Af Amer 39 (*) >90 (mL/min)   LACTIC ACID, PLASMA     Status: Normal   Collection Time   09/13/11  6:27 PM      Component Value Range Comment   Lactic Acid, Venous 1.1  0.5 - 2.2 (mmol/L)   GLUCOSE, CAPILLARY     Status: Normal   Collection Time   09/13/11 10:46 PM      Component Value Range Comment   Glucose-Capillary 94  70 - 99 (mg/dL)    Comment 1 Notify RN     HEMOGLOBIN A1C     Status: Abnormal   Collection Time   09/14/11 12:18 AM      Component Value Range Comment   Hemoglobin A1C 6.6 (*) <5.7 (%)    Mean Plasma Glucose 143 (*) <117 (mg/dL)   URINALYSIS, ROUTINE W REFLEX MICROSCOPIC     Status: Abnormal   Collection Time   09/14/11 12:36 AM      Component Value Range Comment   Color, Urine YELLOW  YELLOW     APPearance CLOUDY (*) CLEAR     Specific Gravity, Urine 1.015  1.005 - 1.030  pH 5.0  5.0 - 8.0     Glucose, UA NEGATIVE  NEGATIVE (mg/dL)    Hgb urine dipstick LARGE (*) NEGATIVE     Bilirubin Urine NEGATIVE  NEGATIVE     Ketones, ur NEGATIVE  NEGATIVE (mg/dL)    Protein, ur 161 (*) NEGATIVE (mg/dL)    Urobilinogen, UA 0.2  0.0 - 1.0 (mg/dL)    Nitrite  NEGATIVE  NEGATIVE     Leukocytes, UA NEGATIVE  NEGATIVE    URINE MICROSCOPIC-ADD ON     Status: Abnormal   Collection Time   09/14/11 12:36 AM      Component Value Range Comment   Squamous Epithelial / LPF RARE  RARE     WBC, UA 3-6  <3 (WBC/hpf)    RBC / HPF 7-10  <3 (RBC/hpf)    Bacteria, UA MANY (*) RARE     Casts GRANULAR CAST (*) NEGATIVE    CARDIAC PANEL(CRET KIN+CKTOT+MB+TROPI)     Status: Abnormal   Collection Time   09/14/11  1:22 AM      Component Value Range Comment   Total CK 3080 (*) 7 - 232 (U/L)    CK, MB 9.3 (*) 0.3 - 4.0 (ng/mL)    Troponin I 0.44 (*) <0.30 (ng/mL)    Relative Index 0.3  0.0 - 2.5    COMPREHENSIVE METABOLIC PANEL     Status: Abnormal   Collection Time   09/14/11  6:10 AM      Component Value Range Comment   Sodium 133 (*) 135 - 145 (mEq/L)    Potassium 4.7  3.5 - 5.1 (mEq/L)    Chloride 102  96 - 112 (mEq/L)    CO2 20  19 - 32 (mEq/L)    Glucose, Bld 88  70 - 99 (mg/dL)    BUN 43 (*) 6 - 23 (mg/dL)    Creatinine, Ser 0.96 (*) 0.50 - 1.35 (mg/dL)    Calcium 7.8 (*) 8.4 - 10.5 (mg/dL)    Total Protein 5.5 (*) 6.0 - 8.3 (g/dL)    Albumin 2.5 (*) 3.5 - 5.2 (g/dL)    AST 045 (*) 0 - 37 (U/L)    ALT 42  0 - 53 (U/L)    Alkaline Phosphatase 52  39 - 117 (U/L)    Total Bilirubin 0.4  0.3 - 1.2 (mg/dL)    GFR calc non Af Amer 34 (*) >90 (mL/min)    GFR calc Af Amer 40 (*) >90 (mL/min)   CBC     Status: Abnormal   Collection Time   09/14/11  6:10 AM      Component Value Range Comment   WBC 8.0  4.0 - 10.5 (K/uL)    RBC 3.48 (*) 4.22 - 5.81 (MIL/uL)    Hemoglobin 9.8 (*) 13.0 - 17.0 (g/dL)    HCT 40.9 (*) 81.1 - 52.0 (%)    MCV 83.3  78.0 - 100.0 (fL)    MCH 28.2  26.0 - 34.0 (pg)    MCHC 33.8  30.0 - 36.0 (g/dL)    RDW 91.4  78.2 - 95.6 (%)    Platelets 121 (*) 150 - 400 (K/uL)   TSH     Status: Normal   Collection Time   09/14/11  6:10 AM      Component Value Range Comment   TSH 0.545  0.350 - 4.500 (uIU/mL)   GLUCOSE, CAPILLARY      Status: Abnormal   Collection Time   09/14/11  8:08  AM      Component Value Range Comment   Glucose-Capillary 100 (*) 70 - 99 (mg/dL)    Comment 1 Notify RN     CARDIAC PANEL(CRET KIN+CKTOT+MB+TROPI)     Status: Abnormal   Collection Time   09/14/11  9:03 AM      Component Value Range Comment   Total CK 3228 (*) 7 - 232 (U/L)    CK, MB 8.0 (*) 0.3 - 4.0 (ng/mL) CRITICAL VALUE NOTED.  VALUE IS CONSISTENT WITH PREVIOUSLY REPORTED AND CALLED VALUE.   Troponin I 0.31 (*) <0.30 (ng/mL)    Relative Index 0.2  0.0 - 2.5    GLUCOSE, CAPILLARY     Status: Abnormal   Collection Time   09/14/11 11:20 AM      Component Value Range Comment   Glucose-Capillary 180 (*) 70 - 99 (mg/dL)    Comment 1 Notify RN     LACTATE DEHYDROGENASE     Status: Abnormal   Collection Time   09/14/11  3:56 PM      Component Value Range Comment   LDH 496 (*) 94 - 250 (U/L)   BILIRUBIN, FRACTIONATED(TOT/DIR/INDIR)     Status: Normal   Collection Time   09/14/11  3:56 PM      Component Value Range Comment   Total Bilirubin 0.3  0.3 - 1.2 (mg/dL)    Bilirubin, Direct <5.7  0.0 - 0.3 (mg/dL) REPEATED TO VERIFY   Indirect Bilirubin NOT CALCULATED  0.3 - 0.9 (mg/dL)   APTT     Status: Abnormal   Collection Time   09/14/11  3:56 PM      Component Value Range Comment   aPTT 47 (*) 24 - 37 (seconds)   PROTIME-INR     Status: Abnormal   Collection Time   09/14/11  3:56 PM      Component Value Range Comment   Prothrombin Time 16.7 (*) 11.6 - 15.2 (seconds)    INR 1.33  0.00 - 1.49    RETICULOCYTES     Status: Abnormal   Collection Time   09/14/11  3:56 PM      Component Value Range Comment   Retic Ct Pct 0.5  0.4 - 3.1 (%)    RBC. 3.31 (*) 4.22 - 5.81 (MIL/uL)    Retic Count, Manual 16.6 (*) 19.0 - 186.0 (K/uL)   CARDIAC PANEL(CRET KIN+CKTOT+MB+TROPI)     Status: Abnormal   Collection Time   09/14/11  4:07 PM      Component Value Range Comment   Total CK 2961 (*) 7 - 232 (U/L)    CK, MB 6.0 (*) 0.3 - 4.0 (ng/mL)     Troponin I <0.30  <0.30 (ng/mL)    Relative Index 0.2  0.0 - 2.5    GLUCOSE, CAPILLARY     Status: Abnormal   Collection Time   09/14/11  4:24 PM      Component Value Range Comment   Glucose-Capillary 113 (*) 70 - 99 (mg/dL)    Comment 1 Notify RN       Dg Chest 2 View  09/13/2011  *RADIOLOGY REPORT*  Clinical Data: Weakness for few days.  Cough.  Shortness of breath. Fever.  Diabetes.  Hypertension.  Smoker.  CHEST - 2 VIEW  Comparison: 05/11/2011  Findings: AP and lateral views.  Mild hyperinflation.  Midline trachea.  Mild cardiomegaly. Mediastinal contours otherwise within normal limits.  No pleural effusion or pneumothorax.  Diffuse peribronchial thickening.  Increased density projecting  over the left heart border and left lung base may correspond to increased posterior density on the lateral.  This area is suboptimally evaluated due to artifact on the lateral.  IMPRESSION:  1.  COPD/chronic bronchitis, mild. 2.  Cannot exclude left lower lobe airspace disease/pneumonia. Possible increased density on frontal corresponding to increased posterior density on lateral.  Of note, lateral view is degraded due to overlying artifact.  Presuming left lower lobe pneumonia is a clinical concern, potential clinical strategies would include antibiotic therapy and short-term follow-up versus full PA and lateral views without artifact.  Original Report Authenticated By: Consuello Bossier, M.D.    Review of Systems  Constitutional: Positive for fever and malaise/fatigue.  Eyes: Negative for blurred vision and double vision.  Respiratory: Negative for cough and sputum production.   Cardiovascular: Negative for chest pain, palpitations, orthopnea, claudication, leg swelling and PND.  Gastrointestinal: Positive for diarrhea. Negative for nausea, vomiting, abdominal pain, blood in stool and melena.  Genitourinary: Positive for urgency. Negative for dysuria and hematuria.  Neurological: Positive for weakness.  Negative for dizziness and headaches.   Blood pressure 120/60, pulse 102, temperature 102.8 F (39.3 C), temperature source Oral, resp. rate 22, height 5\' 8"  (1.727 m), weight 86.4 kg (190 lb 7.6 oz), SpO2 95.00%. Physical Exam  Constitutional: He is oriented to person, place, and time. He appears well-developed. No distress.  HENT:  Head: Normocephalic and atraumatic.  Eyes: EOM are normal. Pupils are equal, round, and reactive to light. No scleral icterus.  Neck: Normal range of motion. Neck supple.  Cardiovascular: Normal rate and regular rhythm.  Exam reveals no friction rub.   No murmur heard. Respiratory: Effort normal. He has rales.  GI: Soft. There is no tenderness.       Bowel sounds mildly hyperactive   Musculoskeletal: He exhibits no edema.  Lymphadenopathy:    He has no cervical adenopathy.  Neurological: He is alert and oriented to person, place, and time. He exhibits normal muscle tone.  Skin: Skin is warm and dry.  Psychiatric: He has a normal mood and affect.    Assessment/Plan: Patient Active Hospital Problem List: Community acquired pneumonia (09/14/2011) COPD (chronic obstructive pulmonary disease) (09/14/2011) Dehydration (09/14/2011) Hyponatremia (09/14/2011) ARF (acute renal failure) (09/14/2011) Tobacco abuse (09/14/2011) Anemia (09/14/2011) Carotid stenosis (09/14/2011) Hypotension (09/14/2011)  Plan:  Patient is likely having a type II demand ischemia MI due to hypotension/dehydration and tachycardia. Patient had a cardiac catheterization this past January with minimal disease in the RCA and LAD 30%.   Continue aspirin and Plavix.  Will follow along. As BP and renal function stabilizes would consider restarting ARB.   Not on BB at home - would not add at this time -- can be considered as OP once he has recovered from his acte illness.  HAGER,BRYAN W 09/14/2011, 6:34 PM   I have seen & examined the patient along with Mr. Leron Croak & reviewed the chart.  I agree  with Mr. Jasper Riling findings, exam & impression/recommendations as noted.  66 y/o man with multiple cardiovascular risk factors listed above who had a relatively normal cath in Jan 2013 prior to carotid stenting, HTN, DM, + FH of CAD & continued smoking and is on ASA/Plavix who was admitted for presumed PNA.  He as transiently hypotesive and has mild renal failure.  We were consulted for minimal Troponin elevation that is now normal.  He denies any cardiac symptoms consistent with MI & no acute ECG changes.  Likely mild myocardial necrosis  from his medical illness and hypotension with renal failure.  I concur with Mr. Jasper Riling recommendations. We will monitor his chart & be available for additional assistance.   Marykay Lex, M.D., M.S. [facility] [address] [place], [place]  [address]  [phone] Pager # [phone]  09/14/2011 8:27 PM

## 2011-09-15 DIAGNOSIS — N179 Acute kidney failure, unspecified: Secondary | ICD-10-CM

## 2011-09-15 DIAGNOSIS — E860 Dehydration: Secondary | ICD-10-CM

## 2011-09-15 DIAGNOSIS — R748 Abnormal levels of other serum enzymes: Secondary | ICD-10-CM

## 2011-09-15 DIAGNOSIS — D696 Thrombocytopenia, unspecified: Secondary | ICD-10-CM

## 2011-09-15 LAB — URINE CULTURE: Culture: NO GROWTH

## 2011-09-15 LAB — DIFFERENTIAL
Basophils Relative: 0 % (ref 0–1)
Eosinophils Absolute: 0 10*3/uL (ref 0.0–0.7)
Lymphs Abs: 0.3 10*3/uL — ABNORMAL LOW (ref 0.7–4.0)
Neutro Abs: 6 10*3/uL (ref 1.7–7.7)
Neutrophils Relative %: 90 % — ABNORMAL HIGH (ref 43–77)

## 2011-09-15 LAB — GLUCOSE, CAPILLARY: Glucose-Capillary: 104 mg/dL — ABNORMAL HIGH (ref 70–99)

## 2011-09-15 LAB — COMPREHENSIVE METABOLIC PANEL
Alkaline Phosphatase: 48 U/L (ref 39–117)
BUN: 27 mg/dL — ABNORMAL HIGH (ref 6–23)
Creatinine, Ser: 1.18 mg/dL (ref 0.50–1.35)
GFR calc Af Amer: 73 mL/min — ABNORMAL LOW (ref >90)
Glucose, Bld: 99 mg/dL (ref 70–99)
Potassium: 3.9 mEq/L (ref 3.5–5.1)
Total Bilirubin: 0.3 mg/dL (ref 0.3–1.2)
Total Protein: 5.6 g/dL — ABNORMAL LOW (ref 6.0–8.3)

## 2011-09-15 LAB — IRON AND TIBC
Iron: 14 ug/dL — ABNORMAL LOW (ref 42–135)
UIBC: 125 ug/dL (ref 125–400)

## 2011-09-15 LAB — CBC
HCT: 28 % — ABNORMAL LOW (ref 39.0–52.0)
Hemoglobin: 9.6 g/dL — ABNORMAL LOW (ref 13.0–17.0)
MCHC: 34.3 g/dL (ref 30.0–36.0)
MCV: 84.3 fL (ref 78.0–100.0)

## 2011-09-15 LAB — RETICULOCYTES: Retic Count, Absolute: 16.6 10*3/uL — ABNORMAL LOW (ref 19.0–186.0)

## 2011-09-15 MED ORDER — ALBUTEROL SULFATE (5 MG/ML) 0.5% IN NEBU
2.5000 mg | INHALATION_SOLUTION | Freq: Four times a day (QID) | RESPIRATORY_TRACT | Status: DC
  Administered 2011-09-15 – 2011-09-16 (×4): 2.5 mg via RESPIRATORY_TRACT
  Filled 2011-09-15 (×4): qty 0.5

## 2011-09-15 MED ORDER — SODIUM CHLORIDE 0.9 % IJ SOLN
10.0000 mL | Freq: Two times a day (BID) | INTRAMUSCULAR | Status: DC
  Administered 2011-09-16: 10 mL via INTRAVENOUS

## 2011-09-15 MED ORDER — IPRATROPIUM BROMIDE 0.02 % IN SOLN
0.5000 mg | Freq: Four times a day (QID) | RESPIRATORY_TRACT | Status: DC
  Administered 2011-09-15 – 2011-09-16 (×4): 0.5 mg via RESPIRATORY_TRACT
  Filled 2011-09-15 (×4): qty 2.5

## 2011-09-15 NOTE — Progress Notes (Signed)
Subjective: Feels better today. No diarrhea since 09/14/11. Producing "gunky" phlegm today.  Objective: Vital signs in last 24 hours: Temp:  [98.5 F (36.9 C)-99.9 F (37.7 C)] 98.5 F (36.9 C) (05/11 1400) Pulse Rate:  [96-106] 106  (05/11 1400) Resp:  [18-24] 24  (05/11 1400) BP: (108-145)/(56-67) 145/57 mmHg (05/11 1400) SpO2:  [89 %-97 %] 95 % (05/11 1524) FiO2 (%):  [95 %] 95 % (05/11 0921) Weight change:  Last BM Date: 09/13/11  Intake/Output from previous day:       Physical Exam: General: Comfortable, alert, communicative, fully oriented, not short of breath at rest,, but short of breath on mild exertion.  HEENT:  Mild clinical pallor, no jaundice, no conjunctival injection or discharge. NECK:  Supple, JVP not seen, no carotid bruits, no palpable lymphadenopathy, no palpable goiter. CHEST:  Patient had bronchial breath sounds in left base, no wheezes of crackles. HEART:  Sounds 1 and 2 heard, normal, regular, no murmurs. ABDOMEN:  Full, soft, non-tender, no palpable organomegaly, no palpable masses, normal bowel sounds. GENITALIA:  Not examined. LOWER EXTREMITIES:  No pitting edema, palpable peripheral pulses. MUSCULOSKELETAL SYSTEM:  Generalized osteoarthritic changes, otherwise, normal. CENTRAL NERVOUS SYSTEM:  No focal neurologic deficit on gross examination.  Lab Results:  [facility] 09/15/11 0555 09/14/11 0610  WBC 6.6 8.0  HGB 9.6* 9.8*  HCT 28.0* 29.0*  PLT 140* 121*    Basename 09/15/11 0555 09/14/11 0610  NA 135 133*  K 3.9 4.7  CL 104 102  CO2 20 20  GLUCOSE 99 88  BUN 27* 43*  CREATININE 1.18 1.96*  CALCIUM 8.0* 7.8*   Recent Results (from the past 240 hour(s))  URINE CULTURE     Status: Normal   Collection Time   09/14/11 12:27 AM      Component Value Range Status Comment   Specimen Description URINE, RANDOM   Final    Special Requests NONE   Final    Culture  Setup Time [id number]   Final    Colony Count NO GROWTH   Final    Culture NO  GROWTH   Final    Report Status 09/15/2011 FINAL   Final   CULTURE, BLOOD (ROUTINE X 2)     Status: Normal (Preliminary result)   Collection Time   09/14/11  2:30 PM      Component Value Range Status Comment   Specimen Description BLOOD RIGHT ARM   Final    Special Requests BOTTLES DRAWN AEROBIC AND ANAEROBIC 10CC   Final    Culture  Setup Time [id number]   Final    Culture     Final    Value:        BLOOD CULTURE RECEIVED NO GROWTH TO DATE CULTURE WILL BE HELD FOR 5 DAYS BEFORE ISSUING A FINAL NEGATIVE REPORT   Report Status PENDING   Incomplete   CULTURE, BLOOD (ROUTINE X 2)     Status: Normal (Preliminary result)   Collection Time   09/14/11  2:43 PM      Component Value Range Status Comment   Specimen Description BLOOD RIGHT HAND   Final    Special Requests BOTTLES DRAWN AEROBIC AND ANAEROBIC 10CC   Final    Culture  Setup Time [id number]   Final    Culture     Final    Value:        BLOOD CULTURE RECEIVED NO GROWTH TO DATE CULTURE WILL BE HELD FOR 5 DAYS BEFORE ISSUING A FINAL NEGATIVE  REPORT   Report Status PENDING   Incomplete   CLOSTRIDIUM DIFFICILE BY PCR     Status: Normal   Collection Time   09/14/11  6:35 PM      Component Value Range Status Comment   C difficile by pcr NEGATIVE  NEGATIVE  Final      Studies/Results: Dg Chest 2 View  09/13/2011  *RADIOLOGY REPORT*  Clinical Data: Weakness for few days.  Cough.  Shortness of breath. Fever.  Diabetes.  Hypertension.  Smoker.  CHEST - 2 VIEW  Comparison: 05/11/2011  Findings: AP and lateral views.  Mild hyperinflation.  Midline trachea.  Mild cardiomegaly. Mediastinal contours otherwise within normal limits.  No pleural effusion or pneumothorax.  Diffuse peribronchial thickening.  Increased density projecting over the left heart border and left lung base may correspond to increased posterior density on the lateral.  This area is suboptimally evaluated due to artifact on the lateral.  IMPRESSION:  1.  COPD/chronic bronchitis,  mild. 2.  Cannot exclude left lower lobe airspace disease/pneumonia. Possible increased density on frontal corresponding to increased posterior density on lateral.  Of note, lateral view is degraded due to overlying artifact.  Presuming left lower lobe pneumonia is a clinical concern, potential clinical strategies would include antibiotic therapy and short-term follow-up versus full PA and lateral views without artifact.  Original Report Authenticated By: Consuello Bossier, M.D.    Medications: Scheduled Meds:   . albuterol  2.5 mg Nebulization TID  . antiseptic oral rinse  15 mL Mouth Rinse BID  . aspirin EC  81 mg Oral QHS  . azithromycin  500 mg Intravenous Q24H  . cefTRIAXone (ROCEPHIN)  IV  1 g Intravenous Q24H  . clopidogrel  75 mg Oral Q breakfast  . ferrous sulfate  325 mg Oral Q breakfast  . guaiFENesin  600 mg Oral BID  . insulin aspart  0-9 Units Subcutaneous TID WC  . ipratropium  0.5 mg Nebulization TID  . nicotine  21 mg Transdermal Daily  . pioglitazone  15 mg Oral QAC breakfast  . senna  1 tablet Oral BID  . sodium chloride  3 mL Intravenous Q12H  . DISCONTD: albuterol  2.5 mg Nebulization Q6H  . DISCONTD: enoxaparin  40 mg Subcutaneous Q24H  . DISCONTD: ipratropium  0.5 mg Nebulization Q6H   Continuous Infusions:   . sodium chloride 125 mL/hr at 09/15/11 0529   PRN Meds:.acetaminophen, acetaminophen, albuterol, morphine injection, ondansetron (ZOFRAN) IV, ondansetron, zolpidem  Assessment/Plan:  Active Problems:  1. Community acquired pneumonia: CXR of 09/13/11, confirmed LLL pneumonia, which is community acquired. Patient is now making purulent phlegm. He is on day# 2 Rocephin/Azithromycin, and already feels clinically better. He has remained apyrexial, and wcc is normal.  2. COPD (chronic obstructive pulmonary disease): Patient has no clinically evident exacerbation at this time, on bronchodilators.  3. Dehydration/volume depletion/AKI: patient presented with weakness  and pre-syncope, after few days of diarrhea. He was hypotensive in the emergency room with systolic blood pressure of 70, sodium was 127, BUN 43, creatinine 2.0. He was managed with intravenous infusion of normal saline, with satisfactory clinical response. As of 09/15/11, BUN was 127, creatinine had normalized at 1.18, and sodium was 135. As a matter of fact, he appeared mildly volume overloaded. IV fluids have been discontinued.  4. Tobacco abuse: Patient unfortunately, continues to smoke. He has been counseled appropriately. Placed on Nicoderm CQ patch.  5. Diabetes: Metformin is on hold at this time. We are managing with  diet, SSI, and Actos, with adequate control.  6: Elevated Troponin: Patient's cardiac enzymes were cycled, and Troponin was found to be elevated at 0.44. Patient had no chest pain. Cardiology consultation was provided by Dr Bryan Lemma, who has opined that patient is likely had a type II demand ischemia MI due to hypotension/dehydration and tachycardia. Patient had a cardiac catheterization this past January with minimal disease in the RCA and LAD 30%. He has recommended continuing aspirin and Plavix. Troponin is now <0.30.  7. Diarrhea: There has ben no recurrence. Likely, this was self-limited, viral in etiology.     LOS: 2 days   Harry Shuck,CHRISTOPHER 09/15/2011, 3:50 PM

## 2011-09-15 NOTE — Progress Notes (Signed)
Physical Therapy Treatment Patient Details Name: PAPA PIERCEFIELD MRN: [id number] DOB: [birth year] Today's Date: 09/15/2011 Time: 4098-1191 PT Time Calculation (min): 40 min  PT Assessment / Plan / Recommendation Comments on Treatment Session  Pt progressing well however needs to utilize RW at times time for improved gait stability. Discussed home management and return to work, feel pt would benefit from HHPT to increase pt's endurance, strength, and promote safe return to prior level of function. RN to speak with case management to see if insurance will cover HHPT.     Follow Up Recommendations  Home health PT;Supervision - Intermittent    Barriers to Discharge  None      Equipment Recommendations  None recommended by PT    Recommendations for Other Services  None   Frequency Min 3X/week   Plan Discharge plan needs to be updated    Precautions / Restrictions Precautions Precautions: Fall Restrictions Weight Bearing Restrictions: No       Mobility  Bed Mobility Details for Bed Mobility Assistance: seated at start Transfers Transfers: Sit to Stand;Stand to Sit Sit to Stand: 5: Supervision;From chair/3-in-1 Stand to Sit: 5: Supervision;To chair/3-in-1 Ambulation/Gait Ambulation/Gait Assistance: 5: Supervision;4: Min guard Ambulation Distance (Feet): 225 Feet Assistive device: Rolling walker;None Ambulation/Gait Assistance Details: Without RW (which pt prefers) pt with very wide base of support and increased instability. With RW pt with much more control and reports of improved hip pain.  Gait Pattern: Step-through pattern;Decreased stride length;Wide base of support Stairs: Yes Stairs Assistance: 3: Mod assist;4: Min guard Stairs Assistance Details (indicate cue type and reason): Moderate assist for first 2 ascents up steps secondary to posterior lean, min guard with next 3 post education. Wife educated on appropriate positioning for safety and pt's tendency to lean  posteriorly.  Stair Management Technique: One rail Left Number of Stairs: 5           PT Goals Acute Rehab PT Goals PT Goal Formulation: With patient Time For Goal Achievement: 09/21/11 Potential to Achieve Goals: Good Pt will go Sit to Stand: Independently PT Goal: Sit to Stand - Progress: Progressing toward goal Pt will go Stand to Sit: Independently PT Goal: Stand to Sit - Progress: Progressing toward goal Pt will Ambulate: with modified independence;with least restrictive assistive device;>150 feet PT Goal: Ambulate - Progress: Progressing toward goal Pt will Go Up / Down Stairs: 3-5 stairs;with supervision PT Goal: Up/Down Stairs - Progress: Progressing toward goal  Visit Information  Last PT Received On: 09/15/11 Assistance Needed: +1             End of Session PT - End of Session Equipment Utilized During Treatment: Gait belt Activity Tolerance: Patient tolerated treatment well Patient left: in chair;with call bell/phone within reach;with family/visitor present Nurse Communication: Mobility status;Other (comment) (need for HHPT)    Wilhemina Bonito 09/15/2011, 5:24 PM  Dahlia Client (Beverely Pace) Carleene Mains PT, DPT Acute Rehabilitation [phone]

## 2011-09-16 DIAGNOSIS — D696 Thrombocytopenia, unspecified: Secondary | ICD-10-CM

## 2011-09-16 DIAGNOSIS — E860 Dehydration: Secondary | ICD-10-CM

## 2011-09-16 DIAGNOSIS — N179 Acute kidney failure, unspecified: Secondary | ICD-10-CM

## 2011-09-16 DIAGNOSIS — R748 Abnormal levels of other serum enzymes: Secondary | ICD-10-CM

## 2011-09-16 LAB — BASIC METABOLIC PANEL
BUN: 17 mg/dL (ref 6–23)
Calcium: 8.6 mg/dL (ref 8.4–10.5)
Creatinine, Ser: 0.92 mg/dL (ref 0.50–1.35)
GFR calc Af Amer: >90 mL/min (ref >90)
GFR calc non Af Amer: 87 mL/min — ABNORMAL LOW (ref >90)

## 2011-09-16 LAB — CBC
HCT: 31.2 % — ABNORMAL LOW (ref 39.0–52.0)
MCHC: 33.7 g/dL (ref 30.0–36.0)
MCV: 84.1 fL (ref 78.0–100.0)
RDW: 15 % (ref 11.5–15.5)

## 2011-09-16 LAB — GLUCOSE, CAPILLARY: Glucose-Capillary: 127 mg/dL — ABNORMAL HIGH (ref 70–99)

## 2011-09-16 LAB — FERRITIN: Ferritin: 3311 ng/mL — ABNORMAL HIGH (ref 22–322)

## 2011-09-16 MED ORDER — FUROSEMIDE 20 MG PO TABS: 20.0000 mg | ORAL_TABLET | Freq: Every day | ORAL | Status: DC

## 2011-09-16 MED ORDER — MOXIFLOXACIN HCL 400 MG PO TABS: 400.0000 mg | ORAL_TABLET | Freq: Every day | ORAL | Status: AC

## 2011-09-16 MED ORDER — GUAIFENESIN ER 600 MG PO TB12: 600.0000 mg | ORAL_TABLET | Freq: Two times a day (BID) | ORAL | Status: DC

## 2011-09-16 MED ORDER — MOXIFLOXACIN HCL 400 MG PO TABS: 400.0000 mg | ORAL_TABLET | Freq: Every day | ORAL | Status: DC

## 2011-09-16 NOTE — Progress Notes (Signed)
Called to pt's room by pt's daughter.  Pt holding IV in hand.  When asked what had happened, the pt stated that the IV was not in his "arm" so he took it out.  Instructed pt that IV was needed for IV ABT.  Pt requesting to leave IV out until evaluated by MD.  Pt expecting to go home today.  Sofia Vanmeter, [facility]

## 2011-09-16 NOTE — Clinical Social Work Psychosocial (Signed)
     Clinical Social Work Department BRIEF PSYCHOSOCIAL ASSESSMENT 09/16/2011  Patient:  Aaron Kerr, Aaron Kerr     Account Number:  [id number]     Admit date:  09/13/2011  Clinical Social Worker:  Lourdes Sledge  Date/Time:  09/16/2011 11:20 AM  Referred by:  Physician  Date Referred:  09/14/2011 Referred for  Advanced Directives   Other Referral:   Interview type:  Patient Other interview type:    PSYCHOSOCIAL DATA Living Status:  WIFE Admitted from facility:   Level of care:   Primary support name:  Wife Primary support relationship to patient:   Degree of support available:   Pt lives at home with wife however did not disclose amount of support.    CURRENT CONCERNS  Other Concerns:   advanced directives    SOCIAL WORK ASSESSMENT / PLAN CSW visited pt room and provided pt with the advanced directives packet per pt request. Pt states he will review it at home with his wife. Pt aware of where he can get the packet notaized. Pt has no concerns or CSW requests. CSW signed off.   Assessment/plan status:  No Further Intervention Required Other assessment/ plan:   Information/referral to community resources:   CSW provided pt with an advanced directives packet.    PATIENTS/FAMILYS RESPONSE TO PLAN OF CARE: Pt alert, oriented and walking around the room with family membe present. Pt reports he hopes to dc today. Pt gladly accepted packet and will review at home with spouse.

## 2011-09-16 NOTE — Discharge Summary (Signed)
Physician Discharge Summary  Patient ID: Aaron Kerr MRN: [id number] DOB/AGE: Sep 04, 1945 66 y.o.  Admit date: 09/13/2011 Discharge date: 09/16/2011  Primary Care Physician:  No primary provider on file. Dr Georgianne Fick.  Discharge Diagnoses:    Patient Active Problem List  Diagnoses  . Community acquired pneumonia  . COPD (chronic obstructive pulmonary disease)  . Dehydration  . Hyponatremia  . ARF (acute renal failure)  . Tobacco abuse  . Anemia  . Carotid stenosis  . Hypotension    Medication List  As of 09/16/2011  3:22 PM   TAKE these medications         aspirin EC 81 MG tablet   Take 81 mg by mouth at bedtime.      clopidogrel 75 MG tablet   Commonly known as: PLAVIX   Take 75 mg by mouth.      furosemide 20 MG tablet   Commonly known as: LASIX   Take 1 tablet (20 mg total) by mouth daily.      guaiFENesin 600 MG 12 hr tablet   Commonly known as: MUCINEX   Take 1 tablet (600 mg total) by mouth 2 (two) times daily.      irbesartan 300 MG tablet   Commonly known as: AVAPRO   Take 300 mg by mouth every morning.      IRON SUPPLEMENT 325 (65 FE) MG tablet   Generic drug: ferrous sulfate   Take 325 mg by mouth daily with breakfast.      moxifloxacin 400 MG tablet   Commonly known as: AVELOX   Take 1 tablet (400 mg total) by mouth daily.      pioglitazone-metformin 15-850 MG per tablet   Commonly known as: ACTOPLUS MET   Take 1 tablet by mouth every morning.             Disposition and Follow-up:  Follow up with primary MD.  Consults:  cardiology Dr Onalee Hua harding, Cardiologist.  Significant Diagnostic Studies:  Dg Chest 2 View  09/13/2011  *RADIOLOGY REPORT*  Clinical Data: Weakness for few days.  Cough.  Shortness of breath. Fever.  Diabetes.  Hypertension.  Smoker.  CHEST - 2 VIEW  Comparison: 05/11/2011  Findings: AP and lateral views.  Mild hyperinflation.  Midline trachea.  Mild cardiomegaly. Mediastinal contours otherwise within normal  limits.  No pleural effusion or pneumothorax.  Diffuse peribronchial thickening.  Increased density projecting over the left heart border and left lung base may correspond to increased posterior density on the lateral.  This area is suboptimally evaluated due to artifact on the lateral.  IMPRESSION:  1.  COPD/chronic bronchitis, mild. 2.  Cannot exclude left lower lobe airspace disease/pneumonia. Possible increased density on frontal corresponding to increased posterior density on lateral.  Of note, lateral view is degraded due to overlying artifact.  Presuming left lower lobe pneumonia is a clinical concern, potential clinical strategies would include antibiotic therapy and short-term follow-up versus full PA and lateral views without artifact.  Original Report Authenticated By: Consuello Bossier, M.D.    Brief H and P: For complete details, refer to admission H and P. However, in brief, this is a 66 year old gentleman with known history of diabetes hypertension and COPD, smoker, presenting to [facility] with generalized weakness and shortness of breath, preceded by 3 days of nausea and diarrhea, associated with fever and mild chills. His 2 grandchildren had apparently been sick and he has been with them. He was weak at home and  almost passed out. He was hypotensive in the emergency room with systolic blood pressure of 70. Chest x-ray showed evidence of pneumonia, and he was admitted for further evaluation, investigation and management.  Physical Exam: On 09/16/11. General: Comfortable, alert, communicative, fully oriented, not short of breath at rest,, but short of breath on mild exertion.  HEENT: Mild clinical pallor, no jaundice, no conjunctival injection or discharge.  NECK: Supple, JVP not seen, no carotid bruits, no palpable lymphadenopathy, no palpable goiter.  CHEST: Patient had bronchial breath sounds in left base, no wheezes of crackles.  HEART: Sounds 1 and 2 heard, normal, regular, no  murmurs.  ABDOMEN: Full, soft, non-tender, no palpable organomegaly, no palpable masses, normal bowel sounds.  GENITALIA: Not examined.  LOWER EXTREMITIES: Moderate pitting edema, palpable peripheral pulses.  MUSCULOSKELETAL SYSTEM: Generalized osteoarthritic changes, otherwise, normal.  CENTRAL NERVOUS SYSTEM: No focal neurologic deficit on gross examination.  Hospital Course:  Active Problems:  1. Community acquired pneumonia: CXR of 09/13/11, confirmed LLL pneumonia, which is community acquired, and patient was producing purulent phlegm. He was managed with iv Rocephin/Azithromycin, rapidly improved clinically, with no documented pyrexia, during this hospitalization. Wcc remained normal. He completed 3 days of above antibiotics on 09/16/11, was transitioned to oral Avelox, for a further 7 days of therapy. l.  2. COPD (chronic obstructive pulmonary disease): Patient had no clinically evident exacerbation, during his hospitalization..  3. Dehydration/volume depletion/AKI: Patient presented with weakness and pre-syncope, after few days of diarrhea. He was hypotensive in the emergency room with systolic blood pressure of 70, sodium was 127, BUN 43, creatinine 2.0. He was managed with intravenous infusion of normal saline, with satisfactory clinical response. As of 09/15/11, BUN was 27, creatinine had normalized at 1.18, and sodium was 135. IV fluids were discontinued on that date, and on 09/16/11, BUN was 17, with creatinine of 0.92.  4. Tobacco abuse: Patient unfortunately, continues to smoke. He has been counseled appropriately, and was managed with Nicoderm CQ patch.  5. Diabetes: Metformin was briefly held, due to AKI, and he remained euglycemic on diet, SSI, and Actos. He may resume Metformin on discharge. 6: Elevated Troponin: Patient's cardiac enzymes were cycled, and Troponin was found to be elevated at 0.44. Patient had no chest pain. Cardiology consultation was provided by Dr Bryan Lemma, who has  opined that patient is likely had a type II demand ischemia MI due to hypotension/dehydration and tachycardia. Patient had a cardiac catheterization this past January with minimal disease in the RCA and LAD 30%. He has recommended continuing aspirin and Plavix. Troponin is now <0.30.  7. Diarrhea: There has ben no recurrence. Likely, this was self-limited, viral in etiology.   Comment: Patient is considered stable for discharge on 09/16/11. He has been placed on low dose Lasix, and recommended elastic stockings, for lower extremity edema. Recommended return to regular duties on 09/23/11.  Time spent on Discharge: 40 mins.  Signed: Eufemia Prindle,CHRISTOPHER 09/16/2011, 3:22 PM

## 2011-09-17 LAB — PATHOLOGIST SMEAR REVIEW: Tech Review: NORMAL

## 2011-09-18 LAB — STOOL CULTURE

## 2011-09-20 LAB — CULTURE, BLOOD (ROUTINE X 2)
Culture  Setup Time: 201305102331
Culture: NO GROWTH

## 2012-02-20 ENCOUNTER — Ambulatory Visit: Payer: 59 | Attending: Orthopaedic Surgery | Admitting: Physical Therapy

## 2012-02-20 DIAGNOSIS — M25559 Pain in unspecified hip: Secondary | ICD-10-CM | POA: Insufficient documentation

## 2012-02-20 DIAGNOSIS — Z96649 Presence of unspecified artificial hip joint: Secondary | ICD-10-CM | POA: Insufficient documentation

## 2012-02-20 DIAGNOSIS — IMO0001 Reserved for inherently not codable concepts without codable children: Secondary | ICD-10-CM | POA: Insufficient documentation

## 2012-02-20 DIAGNOSIS — R262 Difficulty in walking, not elsewhere classified: Secondary | ICD-10-CM | POA: Insufficient documentation

## 2012-02-28 ENCOUNTER — Ambulatory Visit: Payer: 59 | Admitting: Physical Therapy

## 2012-03-03 ENCOUNTER — Ambulatory Visit: Payer: 59 | Admitting: Physical Therapy

## 2012-03-06 ENCOUNTER — Ambulatory Visit: Payer: 59 | Admitting: Physical Therapy

## 2012-03-10 ENCOUNTER — Ambulatory Visit: Payer: 59 | Attending: Orthopaedic Surgery | Admitting: Physical Therapy

## 2012-03-10 DIAGNOSIS — IMO0001 Reserved for inherently not codable concepts without codable children: Secondary | ICD-10-CM | POA: Insufficient documentation

## 2012-03-10 DIAGNOSIS — R262 Difficulty in walking, not elsewhere classified: Secondary | ICD-10-CM | POA: Insufficient documentation

## 2012-03-10 DIAGNOSIS — M25559 Pain in unspecified hip: Secondary | ICD-10-CM | POA: Insufficient documentation

## 2012-03-10 DIAGNOSIS — Z96649 Presence of unspecified artificial hip joint: Secondary | ICD-10-CM | POA: Insufficient documentation

## 2012-03-13 ENCOUNTER — Ambulatory Visit: Payer: 59 | Admitting: Physical Therapy

## 2012-03-17 ENCOUNTER — Ambulatory Visit: Payer: 59 | Admitting: Physical Therapy

## 2012-03-20 ENCOUNTER — Encounter: Payer: 59 | Admitting: Physical Therapy

## 2012-03-20 ENCOUNTER — Ambulatory Visit: Payer: 59 | Admitting: Physical Therapy

## 2012-03-24 ENCOUNTER — Ambulatory Visit: Payer: 59 | Admitting: Physical Therapy

## 2012-03-27 ENCOUNTER — Ambulatory Visit: Payer: 59 | Admitting: Physical Therapy

## 2012-04-01 ENCOUNTER — Ambulatory Visit: Payer: 59 | Admitting: Physical Therapy

## 2012-04-09 ENCOUNTER — Ambulatory Visit: Payer: 59 | Attending: Orthopaedic Surgery | Admitting: Physical Therapy

## 2012-04-09 DIAGNOSIS — M25559 Pain in unspecified hip: Secondary | ICD-10-CM | POA: Insufficient documentation

## 2012-04-09 DIAGNOSIS — Z96649 Presence of unspecified artificial hip joint: Secondary | ICD-10-CM | POA: Insufficient documentation

## 2012-04-09 DIAGNOSIS — IMO0001 Reserved for inherently not codable concepts without codable children: Secondary | ICD-10-CM | POA: Insufficient documentation

## 2012-04-09 DIAGNOSIS — R262 Difficulty in walking, not elsewhere classified: Secondary | ICD-10-CM | POA: Insufficient documentation

## 2012-04-16 ENCOUNTER — Ambulatory Visit: Payer: 59 | Admitting: Physical Therapy

## 2012-04-28 ENCOUNTER — Ambulatory Visit: Payer: 59 | Admitting: Physical Therapy

## 2012-06-10 ENCOUNTER — Encounter (HOSPITAL_BASED_OUTPATIENT_CLINIC_OR_DEPARTMENT_OTHER): Payer: Self-pay | Admitting: *Deleted

## 2012-06-10 ENCOUNTER — Emergency Department (HOSPITAL_BASED_OUTPATIENT_CLINIC_OR_DEPARTMENT_OTHER)
Admission: EM | Admit: 2012-06-10 | Discharge: 2012-06-10 | Disposition: A | Discharge status: Home / Self Care | Payer: Medicare Other | Attending: Emergency Medicine | Admitting: Emergency Medicine

## 2012-06-10 DIAGNOSIS — Z8701 Personal history of pneumonia (recurrent): Secondary | ICD-10-CM | POA: Insufficient documentation

## 2012-06-10 DIAGNOSIS — Z79899 Other long term (current) drug therapy: Secondary | ICD-10-CM | POA: Insufficient documentation

## 2012-06-10 DIAGNOSIS — R197 Diarrhea, unspecified: Secondary | ICD-10-CM | POA: Insufficient documentation

## 2012-06-10 DIAGNOSIS — R6883 Chills (without fever): Secondary | ICD-10-CM | POA: Insufficient documentation

## 2012-06-10 DIAGNOSIS — E119 Type 2 diabetes mellitus without complications: Secondary | ICD-10-CM | POA: Insufficient documentation

## 2012-06-10 DIAGNOSIS — Z7982 Long term (current) use of aspirin: Secondary | ICD-10-CM | POA: Insufficient documentation

## 2012-06-10 DIAGNOSIS — D649 Anemia, unspecified: Secondary | ICD-10-CM | POA: Insufficient documentation

## 2012-06-10 DIAGNOSIS — F17200 Nicotine dependence, unspecified, uncomplicated: Secondary | ICD-10-CM | POA: Insufficient documentation

## 2012-06-10 DIAGNOSIS — Z8739 Personal history of other diseases of the musculoskeletal system and connective tissue: Secondary | ICD-10-CM | POA: Insufficient documentation

## 2012-06-10 DIAGNOSIS — K219 Gastro-esophageal reflux disease without esophagitis: Secondary | ICD-10-CM | POA: Insufficient documentation

## 2012-06-10 DIAGNOSIS — N200 Calculus of kidney: Secondary | ICD-10-CM | POA: Insufficient documentation

## 2012-06-10 DIAGNOSIS — I10 Essential (primary) hypertension: Secondary | ICD-10-CM | POA: Insufficient documentation

## 2012-06-10 LAB — URINALYSIS, ROUTINE W REFLEX MICROSCOPIC
Bilirubin Urine: NEGATIVE
Nitrite: NEGATIVE
Specific Gravity, Urine: 1.022 (ref 1.005–1.030)
pH: 6 (ref 5.0–8.0)

## 2012-06-10 LAB — CBC WITH DIFFERENTIAL/PLATELET
Band Neutrophils: 0 % (ref 0–10)
Basophils Absolute: 0 10*3/uL (ref 0.0–0.1)
Basophils Relative: 0 % (ref 0–1)
Eosinophils Absolute: 0 10*3/uL (ref 0.0–0.7)
Eosinophils Relative: 0 % (ref 0–5)
LUCs, %: 0 % (ref 0–4)
MCH: 26 pg (ref 26.0–34.0)
MCV: 79.4 fL (ref 78.0–100.0)
Metamyelocytes Relative: 0 %
Monocytes Absolute: 0.4 10*3/uL (ref 0.1–1.0)
Monocytes Relative: 5 % (ref 3–12)
Platelets: 221 10*3/uL (ref 150–400)
RDW: 16.4 % — ABNORMAL HIGH (ref 11.5–15.5)

## 2012-06-10 LAB — URINE MICROSCOPIC-ADD ON

## 2012-06-10 LAB — COMPREHENSIVE METABOLIC PANEL
ALT: 13 U/L (ref 0–53)
AST: 35 U/L (ref 0–37)
Albumin: 4.2 g/dL (ref 3.5–5.2)
Alkaline Phosphatase: 116 U/L (ref 39–117)
BUN: 20 mg/dL (ref 6–23)
Potassium: 4.9 mEq/L (ref 3.5–5.1)
Sodium: 137 mEq/L (ref 135–145)
Total Protein: 7.8 g/dL (ref 6.0–8.3)

## 2012-06-10 LAB — LIPASE, BLOOD: Lipase: 22 U/L (ref 11–59)

## 2012-06-10 MED ORDER — ONDANSETRON 8 MG PO TBDP: 8.0000 mg | ORAL_TABLET | Freq: Three times a day (TID) | ORAL | Status: DC | PRN

## 2012-06-10 MED ORDER — METRONIDAZOLE 500 MG PO TABS: 2000.0000 mg | ORAL_TABLET | Freq: Once | ORAL | Status: DC

## 2012-06-10 MED ORDER — PROMETHAZINE HCL 25 MG PO TABS: 25.0000 mg | ORAL_TABLET | Freq: Four times a day (QID) | ORAL | Status: DC | PRN

## 2012-06-10 MED ORDER — ONDANSETRON HCL 4 MG/2ML IJ SOLN
INTRAMUSCULAR | Status: AC
  Administered 2012-06-10: 4 mg
  Filled 2012-06-10: qty 2

## 2012-06-10 MED ORDER — PROMETHAZINE HCL 25 MG/ML IJ SOLN
25.0000 mg | Freq: Once | INTRAMUSCULAR | Status: AC
  Administered 2012-06-10: 25 mg via INTRAVENOUS
  Filled 2012-06-10: qty 1

## 2012-06-10 MED ORDER — SODIUM CHLORIDE 0.9 % IV BOLUS (SEPSIS)
1000.0000 mL | Freq: Once | INTRAVENOUS | Status: AC
  Administered 2012-06-10: 1000 mL via INTRAVENOUS

## 2012-06-10 MED ORDER — ONDANSETRON 8 MG PO TBDP: 8.0000 mg | ORAL_TABLET | Freq: Once | ORAL | Status: DC

## 2012-06-10 NOTE — ED Notes (Signed)
Woke at 2 am in a sweat with vomiting and diarrhea. Nausea on arrival. Yellow skin color. Alert oriented.  Denies pain.

## 2012-06-10 NOTE — ED Provider Notes (Signed)
History     CSN: [id number]  Arrival date & time 06/10/12  1051   First MD Initiated Contact with Patient 06/10/12 1106      No chief complaint on file.   (Consider location/radiation/quality/duration/timing/severity/associated sxs/prior treatment) HPI  Patient with nausea, vomiting, and diarrhea that began at 0200 multiple episodes of vomitng began with food now mostly dry heaves.  Patient with 3 episodes of soft stools, denies dark, tarry, or blood.  Chills , no fever.  PMD [facility].  Past Medical History  Diagnosis Date  . Anemia   . Hypertension   . Pneumonia 09/13/11    "first time"  . Exertional dyspnea   . Sleep apnea 09/14/11    "wear breath rite strips"  . Type II diabetes mellitus   . GERD (gastroesophageal reflux disease)   . Syncope and collapse 09/13/11    "to weak to bear his own weight"  . Kidney stones   . Arthritis     "left hip"    Past Surgical History  Procedure Date  . Tonsillectomy     "as a child"  . Cardiac catheterization   . Carotid endarterectomy 05/15/2011    right  . Eye surgery   . Cataract extraction w/ intraocular lens  implant, bilateral 03/2011    No family history on file.  History  Substance Use Topics  . Smoking status: Current Every Day Smoker -- 0.7 packs/day for 50 years    Types: Cigarettes  . Smokeless tobacco: Not on file  . Alcohol Use: 0.0 oz/week     Comment: 09/14/11 "may have mixed drink 2-3X/yr"      Review of Systems  Constitutional: Positive for chills. Negative for fever and unexpected weight change.  HENT: Negative.   Eyes: Negative.   Respiratory: Negative.   Cardiovascular: Negative.   Gastrointestinal: Positive for nausea, vomiting and diarrhea.    Allergies  Meloxicam  Home Medications   Current Outpatient Rx  Name  Route  Sig  Dispense  Refill  . ASPIRIN EC 81 MG PO TBEC   Oral   Take 81 mg by mouth at bedtime.           . CLOPIDOGREL BISULFATE 75 MG PO  TABS   Oral   Take 75 mg by mouth.           . FERROUS SULFATE 325 (65 FE) MG PO TABS   Oral   Take 325 mg by mouth daily with breakfast.           . FUROSEMIDE 20 MG PO TABS   Oral   Take 1 tablet (20 mg total) by mouth daily.   7 tablet   0   . GUAIFENESIN ER 600 MG PO TB12   Oral   Take 1 tablet (600 mg total) by mouth 2 (two) times daily.   14 tablet   0   . IRBESARTAN 300 MG PO TABS   Oral   Take 300 mg by mouth every morning.           Marland Kitchen PIOGLITAZONE HCL-METFORMIN HCL 15-850 MG PO TABS   Oral   Take 1 tablet by mouth every morning.             BP 184/89  Pulse 73  Resp 20  Ht 5\' 8"  (1.727 m)  Wt 205 lb (92.987 kg)  BMI 31.17 kg/m2  SpO2 99%  Physical Exam  Nursing note and vitals reviewed. Constitutional: He is oriented to person,  place, and time. He appears well-developed and well-nourished.  HENT:  Head: Normocephalic and atraumatic.  Eyes: Conjunctivae normal and EOM are normal. Pupils are equal, round, and reactive to light.  Neck: Normal range of motion.  Cardiovascular: Normal rate, regular rhythm, normal heart sounds and intact distal pulses.   Pulmonary/Chest: Effort normal and breath sounds normal.  Abdominal: Soft. Bowel sounds are normal.  Genitourinary: Penis normal.  Musculoskeletal: Normal range of motion.  Neurological: He is alert and oriented to person, place, and time.  Skin: Skin is warm and dry.  Psychiatric: He has a normal mood and affect. His behavior is normal. Judgment and thought content normal.    ED Course  Procedures (including critical care time)  Labs Reviewed - No data to display No results found.   No diagnosis found.   Date: 06/10/2012  Rate: 70  Rhythm: normal sinus rhythm with pvc  QRS Axis: normal  Intervals: normal  ST/T Wave abnormalities: normal  Conduction Disutrbances:none  Narrative Interpretation:   Old EKG Reviewed: rate has decreased    MDM  Patient given zofran then phenergan.  He  has not vomited and is taking clear liquids.  Plan discharge with precautions and zofran odt.      Hilario Quarry, MD 06/10/12 [id number]

## 2012-06-10 NOTE — ED Notes (Signed)
Pt states his nausea is no better after Zofran.

## 2012-08-05 ENCOUNTER — Other Ambulatory Visit (HOSPITAL_COMMUNITY): Payer: Self-pay | Admitting: Cardiovascular Disease

## 2012-08-05 DIAGNOSIS — I739 Peripheral vascular disease, unspecified: Secondary | ICD-10-CM

## 2012-08-07 ENCOUNTER — Ambulatory Visit (HOSPITAL_COMMUNITY)
Admission: RE | Admit: 2012-08-07 | Discharge: 2012-08-07 | Disposition: A | Discharge status: Home / Self Care | Payer: Medicare Other | Source: Ambulatory Visit | Attending: Cardiovascular Disease | Admitting: Cardiovascular Disease

## 2012-08-07 DIAGNOSIS — I739 Peripheral vascular disease, unspecified: Secondary | ICD-10-CM | POA: Insufficient documentation

## 2012-08-07 DIAGNOSIS — I6523 Occlusion and stenosis of bilateral carotid arteries: Secondary | ICD-10-CM

## 2012-08-07 NOTE — Progress Notes (Signed)
Carotid Duplex Complete Aaron Kerr 

## 2012-08-13 ENCOUNTER — Other Ambulatory Visit: Payer: Self-pay | Admitting: Internal Medicine

## 2012-08-13 DIAGNOSIS — R110 Nausea: Secondary | ICD-10-CM

## 2012-08-13 DIAGNOSIS — R42 Dizziness and giddiness: Secondary | ICD-10-CM

## 2012-08-13 DIAGNOSIS — R270 Ataxia, unspecified: Secondary | ICD-10-CM

## 2012-08-14 ENCOUNTER — Other Ambulatory Visit: Payer: Self-pay | Admitting: Internal Medicine

## 2012-08-14 DIAGNOSIS — Z139 Encounter for screening, unspecified: Secondary | ICD-10-CM

## 2012-08-15 ENCOUNTER — Ambulatory Visit
Admission: RE | Admit: 2012-08-15 | Discharge: 2012-08-15 | Disposition: A | Discharge status: Home / Self Care | Payer: Medicare Other | Source: Ambulatory Visit | Attending: Internal Medicine | Admitting: Internal Medicine

## 2012-08-15 DIAGNOSIS — R110 Nausea: Secondary | ICD-10-CM

## 2012-08-15 DIAGNOSIS — Z139 Encounter for screening, unspecified: Secondary | ICD-10-CM

## 2012-08-15 DIAGNOSIS — R42 Dizziness and giddiness: Secondary | ICD-10-CM

## 2012-08-15 DIAGNOSIS — R270 Ataxia, unspecified: Secondary | ICD-10-CM

## 2012-08-17 ENCOUNTER — Other Ambulatory Visit: Payer: 59

## 2012-08-22 ENCOUNTER — Emergency Department (HOSPITAL_BASED_OUTPATIENT_CLINIC_OR_DEPARTMENT_OTHER)
Admission: EM | Admit: 2012-08-22 | Discharge: 2012-08-22 | Disposition: A | Discharge status: Home / Self Care | Payer: Medicare Other | Attending: Emergency Medicine | Admitting: Emergency Medicine

## 2012-08-22 ENCOUNTER — Encounter (HOSPITAL_BASED_OUTPATIENT_CLINIC_OR_DEPARTMENT_OTHER): Payer: Self-pay

## 2012-08-22 ENCOUNTER — Emergency Department (HOSPITAL_BASED_OUTPATIENT_CLINIC_OR_DEPARTMENT_OTHER): Payer: Medicare Other

## 2012-08-22 DIAGNOSIS — Z7982 Long term (current) use of aspirin: Secondary | ICD-10-CM | POA: Insufficient documentation

## 2012-08-22 DIAGNOSIS — W1809XA Striking against other object with subsequent fall, initial encounter: Secondary | ICD-10-CM | POA: Insufficient documentation

## 2012-08-22 DIAGNOSIS — Y9289 Other specified places as the place of occurrence of the external cause: Secondary | ICD-10-CM | POA: Insufficient documentation

## 2012-08-22 DIAGNOSIS — Y9389 Activity, other specified: Secondary | ICD-10-CM | POA: Insufficient documentation

## 2012-08-22 DIAGNOSIS — I10 Essential (primary) hypertension: Secondary | ICD-10-CM | POA: Insufficient documentation

## 2012-08-22 DIAGNOSIS — E119 Type 2 diabetes mellitus without complications: Secondary | ICD-10-CM | POA: Insufficient documentation

## 2012-08-22 DIAGNOSIS — D649 Anemia, unspecified: Secondary | ICD-10-CM | POA: Insufficient documentation

## 2012-08-22 DIAGNOSIS — Z87442 Personal history of urinary calculi: Secondary | ICD-10-CM | POA: Insufficient documentation

## 2012-08-22 DIAGNOSIS — S0003XA Contusion of scalp, initial encounter: Secondary | ICD-10-CM | POA: Insufficient documentation

## 2012-08-22 DIAGNOSIS — W1789XA Other fall from one level to another, initial encounter: Secondary | ICD-10-CM | POA: Insufficient documentation

## 2012-08-22 DIAGNOSIS — Z79899 Other long term (current) drug therapy: Secondary | ICD-10-CM | POA: Insufficient documentation

## 2012-08-22 DIAGNOSIS — Z8701 Personal history of pneumonia (recurrent): Secondary | ICD-10-CM | POA: Insufficient documentation

## 2012-08-22 DIAGNOSIS — F17200 Nicotine dependence, unspecified, uncomplicated: Secondary | ICD-10-CM | POA: Insufficient documentation

## 2012-08-22 DIAGNOSIS — Z8719 Personal history of other diseases of the digestive system: Secondary | ICD-10-CM | POA: Insufficient documentation

## 2012-08-22 DIAGNOSIS — Z8739 Personal history of other diseases of the musculoskeletal system and connective tissue: Secondary | ICD-10-CM | POA: Insufficient documentation

## 2012-08-22 DIAGNOSIS — S0083XA Contusion of other part of head, initial encounter: Secondary | ICD-10-CM

## 2012-08-22 DIAGNOSIS — R42 Dizziness and giddiness: Secondary | ICD-10-CM | POA: Insufficient documentation

## 2012-08-22 NOTE — ED Notes (Signed)
Hematoma and bruising to left side of forehead.  Pt has had dizziness daily since February and seen by PMD and "vertigo specialist", had CT scans and an MRI without a diagnosis.

## 2012-08-22 NOTE — ED Notes (Signed)
MD at bedside. 

## 2012-08-22 NOTE — ED Notes (Signed)
Patient transported to CT 

## 2012-08-22 NOTE — ED Notes (Signed)
Pt has chronic dizziness, states he turned too fsat, fell striking his head on the pavement.  Hematoma to forehead.  Denies LOC.

## 2012-08-22 NOTE — ED Provider Notes (Signed)
History     CSN: [id number]  Arrival date & time 08/22/12  1819   First MD Initiated Contact with Patient 08/22/12 2129      Chief Complaint  Patient presents with  . Dizziness  . Fall  . Head Injury    (Consider location/radiation/quality/duration/timing/severity/associated sxs/prior treatment) Patient is a 67 y.o. male presenting with fall. The history is provided by the patient, the spouse and medical records. No language interpreter was used.  Fall The accident occurred 3 to 5 hours ago. Fall occurred: Pt has had a fall today, striking the right forehead and contusing his hands.  He had turned suddenly, lost his balance, and fell.Marland Kitchen He fell from a height of 1 to 2 ft. Impact surface: A gravel driveway. The point of impact was the head. The pain is present in the head. The pain is at a severity of 8/10. The pain is severe. He was ambulatory at the scene. There was no entrapment after the fall. There was no drug use involved in the accident. There was no alcohol use involved in the accident. Pertinent negatives include no fever. Associated symptoms comments: He has had vertigo since June 10, 2012.  He has had MRI of the brain, which did not show any lesion that would have caused his vertigo.  His physician, Dr. Nicholos Johns, is arranging neurologic consultation, according to pt's wife.  She would like him to be seen by an ear, nose, and throat specialist.. Prehospitalization: EMS was not called.    Past Medical History  Diagnosis Date  . Anemia   . Hypertension   . Pneumonia 09/13/11    "first time"  . Exertional dyspnea   . Sleep apnea 09/14/11    "wear breath rite strips"  . Type II diabetes mellitus   . GERD (gastroesophageal reflux disease)   . Syncope and collapse 09/13/11    "to weak to bear his own weight"  . Kidney stones   . Arthritis     "left hip"    Past Surgical History  Procedure Laterality Date  . Tonsillectomy      "as a child"  . Cardiac catheterization     . Carotid endarterectomy  05/15/2011    right  . Eye surgery    . Cataract extraction w/ intraocular lens  implant, bilateral  03/2011  . Hip surgery      No family history on file.  History  Substance Use Topics  . Smoking status: Current Every Day Smoker -- 0.75 packs/day for 50 years    Types: Cigarettes  . Smokeless tobacco: Not on file  . Alcohol Use: 0.0 oz/week     Comment: 09/14/11 "may have mixed drink 2-3X/yr"      Review of Systems  Constitutional: Negative for fever and chills.  Eyes: Negative.   Respiratory: Negative.   Cardiovascular: Negative.   Gastrointestinal: Negative.   Genitourinary: Negative.   Musculoskeletal: Negative.   Skin: Negative.   Neurological: Positive for dizziness.  Psychiatric/Behavioral: Negative.     Allergies  Meloxicam  Home Medications   Current Outpatient Rx  Name  Route  Sig  Dispense  Refill  . aspirin EC 81 MG tablet   Oral   Take 81 mg by mouth at bedtime.           . clopidogrel (PLAVIX) 75 MG tablet   Oral   Take 75 mg by mouth.           . ferrous sulfate (IRON SUPPLEMENT) 325 (65  FE) MG tablet   Oral   Take 325 mg by mouth daily with breakfast.           . furosemide (LASIX) 20 MG tablet   Oral   Take 1 tablet (20 mg total) by mouth daily.   7 tablet   0   . guaiFENesin (MUCINEX) 600 MG 12 hr tablet   Oral   Take 1 tablet (600 mg total) by mouth 2 (two) times daily.   14 tablet   0   . irbesartan (AVAPRO) 300 MG tablet   Oral   Take 300 mg by mouth every morning.           . metFORMIN (GLUCOPHAGE) 500 MG tablet   Oral   Take 500 mg by mouth 2 (two) times daily with a meal.         . ondansetron (ZOFRAN ODT) 8 MG disintegrating tablet   Oral   Take 1 tablet (8 mg total) by mouth every 8 (eight) hours as needed for nausea.   20 tablet   0   . pioglitazone-metformin (ACTOPLUS MET) 15-850 MG per tablet   Oral   Take 1 tablet by mouth every morning.          . promethazine  (PHENERGAN) 25 MG tablet   Oral   Take 1 tablet (25 mg total) by mouth every 6 (six) hours as needed for nausea.   30 tablet   0     BP 180/76  Pulse 77  Temp(Src) 97.9 F (36.6 C) (Oral)  Resp 16  SpO2 99%  Physical Exam  Nursing note and vitals reviewed. Constitutional: He is oriented to person, place, and time. He appears well-developed and well-nourished.  In moderate distress with pain in the right forehead.  HENT:  Right Ear: External ear normal.  Left Ear: External ear normal.  Nose: Nose normal.  Mouth/Throat: Oropharynx is clear and moist.  2 x 2 cm contusion on right forehead.  No bony deformity of the skull.  Eyes: Conjunctivae and EOM are normal. Pupils are equal, round, and reactive to light. No scleral icterus.  Neck: Normal range of motion. Neck supple.  Cardiovascular: Normal rate, regular rhythm and normal heart sounds.   Pulmonary/Chest: Effort normal and breath sounds normal.  Abdominal: Soft. Bowel sounds are normal.  Musculoskeletal:  Minor contusions on his fingers.  Neurological: He is alert and oriented to person, place, and time.  No sensory or motor deficit.  Skin: Skin is warm and dry.  Psychiatric: He has a normal mood and affect. His behavior is normal.    ED Course  Procedures (including critical care time)  Labs Reviewed - No data to display Ct Head Wo Contrast  08/22/2012  *RADIOLOGY REPORT*  Clinical Data: Fall, head injury.  CT HEAD WITHOUT CONTRAST  Technique:  Contiguous axial images were obtained from the base of the skull through the vertex without contrast.  Comparison: MRI 08/15/2012  Findings: Two hyperdense areas are noted in the left occipital lobe.  These were present on prior MRI.  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  The more medial left occipital lesion is higher density and measures 15 mm. This was harder to see on prior MRI, but  also was present. 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  No other areas of abnormality.  No hydrocephalus or acute infarction.  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 Soft tissue swelling in the left scalp.  No underlying calvarial abnormality.  IMPRESSION: 2 left occipital hyperdense lesions as described above.  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 No definite significant change since recent MRI.  Old right occipital infarct.   Original Report Authenticated By: Charlett Nose, M.D.     Date: 08/22/2012  Rate:81  Rhythm: normal sinus rhythm  QRS Axis: normal  Intervals: normal  ST/T Wave abnormalities: normal  Conduction Disutrbances:none  Narrative Interpretation: Normal EKG  Old EKG Reviewed: unchanged  Pt did not want pain medication. Given referral to Dr. Serena Colonel, ENT specialist, to have further workup of his dizziness.     1. Contusion of forehead, initial encounter   2. Vertigo       Carleene Cooper III, MD 08/23/12 1314

## 2012-08-22 NOTE — ED Notes (Signed)
Ice pack was given to the patient for his head. The patient is undressed from the waist up and in a gown. The bed rails are up and the beds is locked and in the lowest position.

## 2012-08-25 ENCOUNTER — Encounter: Payer: Self-pay | Admitting: *Deleted

## 2012-09-02 NOTE — Progress Notes (Deleted)
Subjective:    Patient ID: Aaron Kerr is a 67 y.o. male.  HPI {Common ambulatory SmartLinks:19316}  Review of Systems  Objective:  Neurologic Exam  Physical Exam  Assessment:   ***  Plan:   ***

## 2012-09-03 ENCOUNTER — Encounter: Payer: Self-pay | Admitting: Diagnostic Neuroimaging

## 2012-09-03 ENCOUNTER — Ambulatory Visit (INDEPENDENT_AMBULATORY_CARE_PROVIDER_SITE_OTHER): Payer: Medicare Other | Admitting: Diagnostic Neuroimaging

## 2012-09-03 VITALS — BP 170/80 | HR 85 | Temp 97.3°F | Ht 68.0 in | Wt 198.0 lb

## 2012-09-03 DIAGNOSIS — R42 Dizziness and giddiness: Secondary | ICD-10-CM

## 2012-09-03 NOTE — Progress Notes (Signed)
[facility]  PATIENT: Aaron Kerr DOB: 06-30-1945  REFERRING CLINICIAN: Nicholos Johns HISTORY FROM: Patient and spouse REASON FOR VISIT: Persistent dizziness, vomiting, headaches.   HISTORICAL  CHIEF COMPLAINT:  Chief Complaint  Patient presents with  . Dizziness    NP referral #6.. severe dizziness,vomiting,headaches,neck popping    HISTORY OF PRESENT ILLNESS:  67 year old male with history of right carotid stent, diabetes, left hip replacement, here for evaluation of dizziness, vomiting, headaches.  June 10, 2012 patient had sudden onset of vomiting, sweating, slurred speech. Patient went to the emergency room was diagnosed with norovirus infection and treated conservatively. Ever since that time has had intermittent nausea, headache, dizziness. Symptoms are gradually worsened since that time.  Patient also reports aching sensation in the back of his neck and head. He also has intermittent lightheadedness, dizziness, balance difficulties. Patient had right temporal shingles infection 3 weeks ago. Has tried meclizine without benefit. Patient has had MRI and CT scan which I reviewed.  Patient fell down on 08/22/2012, struck his head was unresponsive for a few minutes. She went to the emergency room had CT scan of the head.   REVIEW OF SYSTEMS: Full 14 system review of systems performed and notable only for fatigue headache weakness dizziness.  ALLERGIES: Allergies  Allergen Reactions  . Meloxicam Swelling    Ankles became swollen    HOME MEDICATIONS: Outpatient Prescriptions Prior to Visit  Medication Sig Dispense Refill  . aspirin EC 81 MG tablet Take 81 mg by mouth at bedtime.        . clopidogrel (PLAVIX) 75 MG tablet Take 75 mg by mouth.        . irbesartan (AVAPRO) 300 MG tablet Take 300 mg by mouth every morning.        . metFORMIN (GLUCOPHAGE) 500 MG tablet Take 500 mg by mouth 2 (two) times daily with a meal.      . ferrous sulfate (IRON  SUPPLEMENT) 325 (65 FE) MG tablet Take 325 mg by mouth daily with breakfast.        . furosemide (LASIX) 20 MG tablet Take 1 tablet (20 mg total) by mouth daily.  7 tablet  0  . guaiFENesin (MUCINEX) 600 MG 12 hr tablet Take 1 tablet (600 mg total) by mouth 2 (two) times daily.  14 tablet  0  . ondansetron (ZOFRAN ODT) 8 MG disintegrating tablet Take 1 tablet (8 mg total) by mouth every 8 (eight) hours as needed for nausea.  20 tablet  0  . pioglitazone-metformin (ACTOPLUS MET) 15-850 MG per tablet Take 1 tablet by mouth every morning.       . promethazine (PHENERGAN) 25 MG tablet Take 1 tablet (25 mg total) by mouth every 6 (six) hours as needed for nausea.  30 tablet  0   No facility-administered medications prior to visit.    PAST MEDICAL HISTORY: Past Medical History  Diagnosis Date  . Anemia   . Hypertension   . Pneumonia 09/13/11    "first time"  . Exertional dyspnea   . Sleep apnea 09/14/11    "wear breath rite strips"  . Type II diabetes mellitus   . GERD (gastroesophageal reflux disease)   . Syncope and collapse 09/13/11    "to weak to bear his own weight"  . Kidney stones   . Arthritis     "left hip"    PAST SURGICAL HISTORY: Past Surgical History  Procedure Laterality Date  . Tonsillectomy      "as  a child"  . Cardiac catheterization    . Carotid endarterectomy  05/15/2011    right  . Eye surgery    . Cataract extraction w/ intraocular lens  implant, bilateral  03/2011  . Hip surgery      FAMILY HISTORY: Family History  Problem Relation Age of Onset  . Cancer Paternal Grandfather     SOCIAL HISTORY:  History   Social History  . Marital Status: Married    Spouse Name: N/A    Number of Children: 3  . Years of Education: 12   Occupational History  . Not on file.   Social History Main Topics  . Smoking status: Current Every Day Smoker -- 0.75 packs/day for 50 years    Types: Cigarettes  . Smokeless tobacco: Former Neurosurgeon  . Alcohol Use: 0.0 oz/week      Comment: 09/14/11 "may have mixed drink 2-3X/yr"  . Drug Use: No  . Sexually Active: Not Currently   Other Topics Concern  . Not on file   Social History Narrative  . No narrative on file     PHYSICAL EXAM  Filed Vitals:   09/03/12 0834 09/03/12 0836 09/03/12 0837  BP: 142/79 170/80   Pulse: 98 85   Temp: 97.3 F (36.3 C)    TempSrc: Oral    Height: 5\' 8"  (1.727 m)  5\' 8"  (1.727 m)  Weight: 198 lb (89.812 kg)  198 lb (89.812 kg)   Body mass index is 30.11 kg/(m^2).  GENERAL EXAM: Patient is in no distress; DIX HALLPIKE REPRODUCES MILD DIZZINESS; NO NYSTAGMUS.  CARDIOVASCULAR: Regular rate and rhythm, no murmurs, no carotid bruits  NEUROLOGIC: MENTAL STATUS: awake, alert, language fluent, comprehension intact, naming intact CRANIAL NERVE: no papilledema on fundoscopic exam, pupils equal and reactive to light, visual fields full to confrontation, extraocular muscles intact, no nystagmus, facial sensation and strength symmetric, uvula midline, shoulder shrug symmetric, tongue midline. MOTOR: normal bulk and tone, full strength in the BUE, BLE SENSORY: VIB < 2 SEC AT TOES COORDINATION: finger-nose-finger, fine finger movements normal REFLEXES: deep tendon reflexes present except LEFT KNEE AND ANKLE ABSENT.  GAIT/STATION: narrow based gait; UNSTEADY GAIT; USES SINGLE-PRONG CANE  NO NYSTAGMUS ON DIX HALLPIKE MANEUVER. PRODUCES NAUSEA AND LIGHTHEADEDNESS.   DIAGNOSTIC DATA (LABS, IMAGING, TESTING) - I reviewed patient records, labs, notes, testing and imaging myself where available.  Lab Results  Component Value Date   WBC 7.7 06/10/2012   HGB 12.5* 06/10/2012   HCT 38.1* 06/10/2012   MCV 79.4 06/10/2012   PLT 221 06/10/2012      Component Value Date/Time   NA 137 06/10/2012 1110   K 4.9 06/10/2012 1110   CL 100 06/10/2012 1110   CO2 24 06/10/2012 1110   GLUCOSE 213* 06/10/2012 1110   BUN 20 06/10/2012 1110   CREATININE 0.70 06/10/2012 1110   CALCIUM 9.7 06/10/2012 1110   PROT 7.8  06/10/2012 1110   ALBUMIN 4.2 06/10/2012 1110   AST 35 06/10/2012 1110   ALT 13 06/10/2012 1110   ALKPHOS 116 06/10/2012 1110   BILITOT 0.3 06/10/2012 1110   GFRNONAA >90 06/10/2012 1110   GFRAA >90 06/10/2012 1110   No results found for this basename: CHOL,  HDL,  LDLCALC,  LDLDIRECT,  TRIG,  CHOLHDL   Lab Results  Component Value Date   HGBA1C 6.6* 09/14/2011   Lab Results  Component Value Date   VITAMINB12 1370* 09/15/2011   Lab Results  Component Value Date   TSH 0.545 09/14/2011  08/15/12 MRI brain - there are 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 Mild-moderate atrophy. Scattered chronic small vessel ischemic disease. 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   ASSESSMENT AND PLAN  67 y.o. year old male  has a past medical history of Anemia; Hypertension; Pneumonia (09/13/11); Exertional dyspnea; Sleep apnea (09/14/11); Type II diabetes mellitus; GERD (gastroesophageal reflux disease); Syncope and collapse (09/13/11); Kidney stones; and Arthritis. here with nausea, dizziness, and headaches since Feb 2014.  History, exam and neuroimaging studies are concerning for CNS mass lesions in the left occipital and cerebellar regions. 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 However I see an additional midline cerebellar lesion with mass effect on the fourth ventricle, with some hemorrhagic features. 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 I will check MRI of the brain with and without contrast for further evaluation. Based on this result, I may refer the patient for neurosurgical consultation.   I think the patient should  have malignancy screening of the chest abdomen and pelvis with CT scan. I agree with ongoing evaluation of enlarged lymph node which is planned to be biopsied.   Orders Placed This Encounter  Procedures  . MR Brain W Wo Contrast    Suanne Marker, MD 09/03/2012, 9:37 AM (with assistance from [facility] NP-C) Certified in Neurology, Neurophysiology and Neuroimaging  [facility] [address], [address] [place], [place] [address] [phone]

## 2012-09-03 NOTE — Patient Instructions (Signed)
Plan CT angiogram head and neck.  Increase fluids to stay hydrated.

## 2012-09-04 ENCOUNTER — Other Ambulatory Visit: Payer: Self-pay | Admitting: Otolaryngology

## 2012-09-08 ENCOUNTER — Other Ambulatory Visit (HOSPITAL_COMMUNITY): Payer: Self-pay | Admitting: Otolaryngology

## 2012-09-08 DIAGNOSIS — H8110 Benign paroxysmal vertigo, unspecified ear: Secondary | ICD-10-CM

## 2012-09-08 DIAGNOSIS — R590 Localized enlarged lymph nodes: Secondary | ICD-10-CM

## 2012-09-11 ENCOUNTER — Encounter (HOSPITAL_COMMUNITY): Payer: Self-pay | Admitting: Pharmacist

## 2012-09-11 ENCOUNTER — Telehealth: Payer: Self-pay | Admitting: Diagnostic Neuroimaging

## 2012-09-11 ENCOUNTER — Other Ambulatory Visit: Payer: Self-pay | Admitting: Radiology

## 2012-09-11 MED ORDER — ALPRAZOLAM 1 MG PO TABS: 1.0000 mg | ORAL_TABLET | ORAL | Status: AC | PRN

## 2012-09-11 NOTE — Telephone Encounter (Signed)
Done. Rx printed. Please send in.

## 2012-09-11 NOTE — Telephone Encounter (Signed)
Message was sent to me at 3:53pm saying patient wants Xanax called in by 4:00.  This requires provider authorization, as it is a controlled substance.  Will forward request to MD for approval.

## 2012-09-11 NOTE — Telephone Encounter (Signed)
Message copied by Malachy Moan on Thu Sep 11, 2012  4:03 PM ------      Message from: Warren Lacy A      Created: Thu Sep 11, 2012  3:53 PM      Contact: Lowella Dell has to be called in at [facility] before 4pm      He needs it before surgery(MRI) tomorrow ------

## 2012-09-12 ENCOUNTER — Ambulatory Visit (INDEPENDENT_AMBULATORY_CARE_PROVIDER_SITE_OTHER): Payer: Medicare Other | Admitting: Diagnostic Neuroimaging

## 2012-09-12 ENCOUNTER — Encounter: Payer: Self-pay | Admitting: Diagnostic Neuroimaging

## 2012-09-12 DIAGNOSIS — D496 Neoplasm of unspecified behavior of brain: Secondary | ICD-10-CM

## 2012-09-12 DIAGNOSIS — C801 Malignant (primary) neoplasm, unspecified: Secondary | ICD-10-CM

## 2012-09-12 DIAGNOSIS — C799 Secondary malignant neoplasm of unspecified site: Secondary | ICD-10-CM

## 2012-09-12 DIAGNOSIS — R112 Nausea with vomiting, unspecified: Secondary | ICD-10-CM

## 2012-09-12 DIAGNOSIS — Z129 Encounter for screening for malignant neoplasm, site unspecified: Secondary | ICD-10-CM

## 2012-09-12 DIAGNOSIS — N200 Calculus of kidney: Secondary | ICD-10-CM

## 2012-09-12 NOTE — Progress Notes (Signed)
[facility]  PATIENT: Aaron Kerr DOB: Mar 01, 1946  REFERRING CLINICIAN:  HISTORY FROM: Patient and spouse REASON FOR VISIT: Persistent dizziness, vomiting, headaches.   HISTORICAL  CHIEF COMPLAINT:  Chief Complaint  Patient presents with  . Results    MRI    HISTORY OF PRESENT ILLNESS:   UPDATE 09/12/12: Patient is here for followup. Patient had MRI of the brain with and without contrast today and came here afterwards to discuss results. 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 Patient's neurologic symptoms are stable. Still some intermittent nausea, intermittent balance and dizziness. Also with intermittent headache. No significant worsening since last visit.  PRIOR HPI (09/03/12): 67 year old male with history of right carotid stent, diabetes, left hip replacement, here for evaluation of dizziness, vomiting, headaches.  June 10, 2012 patient had sudden onset of vomiting, sweating, slurred speech. Patient went to the emergency room was diagnosed with norovirus infection and treated conservatively. Ever since that time has had intermittent nausea, headache, dizziness. Symptoms are gradually worsened since that time.  Patient also reports aching sensation in the back of his neck and head. He also has intermittent lightheadedness, dizziness, balance difficulties. Patient had right temporal shingles infection 3 weeks ago. Has tried meclizine without benefit. Patient has had MRI and CT scan which I reviewed.  Patient fell down on 08/22/2012, struck his head was unresponsive for a few minutes. She went to the emergency room had CT scan of the head.   REVIEW OF SYSTEMS: Full 14 system review of systems performed and notable only for fatigue headache weakness dizziness.  ALLERGIES: Allergies  Allergen Reactions  . Meloxicam Swelling    Ankles became swollen    HOME MEDICATIONS: Outpatient  Prescriptions Prior to Visit  Medication Sig Dispense Refill  . acetaminophen (TYLENOL) 500 MG tablet Take 500-1,000 mg by mouth every 6 (six) hours as needed (headache).      . ALPRAZolam (XANAX) 1 MG tablet Take 1 tablet (1 mg total) by mouth as needed for sleep (for premedication before MRI; may repeat x 1).  2 tablet  0  . aspirin EC 81 MG tablet Take 81 mg by mouth at bedtime.       . clopidogrel (PLAVIX) 75 MG tablet Take 75 mg by mouth every morning.       . irbesartan (AVAPRO) 300 MG tablet Take 300 mg by mouth every morning.       . metFORMIN (GLUCOPHAGE-XR) 500 MG 24 hr tablet Take 1,000 mg by mouth 2 (two) times daily with a meal.      . promethazine (PHENERGAN) 25 MG tablet Take 25 mg by mouth every 6 (six) hours as needed for nausea.       No facility-administered medications prior to visit.    PAST MEDICAL HISTORY: Past Medical History  Diagnosis Date  . Anemia   . Hypertension   . Pneumonia 09/13/11    "first time"  . Exertional dyspnea   . Sleep apnea 09/14/11    "wear breath rite strips"  . Type II diabetes mellitus   . GERD (gastroesophageal reflux disease)   . Syncope and collapse 09/13/11    "to weak to bear his own weight"  . Kidney stones   . Arthritis     "left hip"    PAST SURGICAL HISTORY: Past Surgical History  Procedure Laterality Date  . Tonsillectomy      "as a child"  . Cardiac catheterization    .  Carotid endarterectomy  05/15/2011    right  . Eye surgery    . Cataract extraction w/ intraocular lens  implant, bilateral  03/2011  . Hip surgery      FAMILY HISTORY: Family History  Problem Relation Age of Onset  . Cancer Paternal Grandfather     SOCIAL HISTORY:  History   Social History  . Marital Status: Married    Spouse Name: N/A    Number of Children: 3  . Years of Education: 12   Occupational History  . Not on file.   Social History Main Topics  . Smoking status: Current Every Day Smoker -- 0.75 packs/day for 50 years     Types: Cigarettes  . Smokeless tobacco: Former Neurosurgeon  . Alcohol Use: 0.0 oz/week     Comment: 09/14/11 "may have mixed drink 2-3X/yr"  . Drug Use: No  . Sexually Active: Not Currently   Other Topics Concern  . Not on file   Social History Narrative  . No narrative on file     PHYSICAL EXAM  There were no vitals filed for this visit. There is no weight on file to calculate BMI.  GENERAL EXAM: Patient is in no distress.  CARDIOVASCULAR: Regular rate and rhythm, no murmurs, no carotid bruits  NEUROLOGIC: MENTAL STATUS: awake, alert, language fluent, comprehension intact, naming intact CRANIAL NERVE: no papilledema on fundoscopic exam, pupils equal and reactive to light, visual fields full to confrontation, extraocular muscles intact, no nystagmus, facial sensation and strength symmetric, uvula midline, shoulder shrug symmetric, tongue midline. MOTOR: normal bulk and tone, full strength in the BUE, BLE SENSORY: VIB < 2 SEC AT TOES COORDINATION: finger-nose-finger, fine finger movements normal REFLEXES: deep tendon reflexes present except LEFT KNEE AND ANKLE ABSENT.  GAIT/STATION: narrow based gait; UNSTEADY GAIT; USES SINGLE-PRONG CANE   DIAGNOSTIC DATA (LABS, IMAGING, TESTING) - I reviewed patient records, labs, notes, testing and imaging myself where available.  Lab Results  Component Value Date   WBC 7.7 06/10/2012   HGB 12.5* 06/10/2012   HCT 38.1* 06/10/2012   MCV 79.4 06/10/2012   PLT 221 06/10/2012      Component Value Date/Time   NA 137 06/10/2012 1110   K 4.9 06/10/2012 1110   CL 100 06/10/2012 1110   CO2 24 06/10/2012 1110   GLUCOSE 213* 06/10/2012 1110   BUN 20 06/10/2012 1110   CREATININE 0.70 06/10/2012 1110   CALCIUM 9.7 06/10/2012 1110   PROT 7.8 06/10/2012 1110   ALBUMIN 4.2 06/10/2012 1110   AST 35 06/10/2012 1110   ALT 13 06/10/2012 1110   ALKPHOS 116 06/10/2012 1110   BILITOT 0.3 06/10/2012 1110   GFRNONAA >90 06/10/2012 1110   GFRAA >90 06/10/2012 1110   No results found for  this basename: CHOL,  HDL,  LDLCALC,  LDLDIRECT,  TRIG,  CHOLHDL   Lab Results  Component Value Date   HGBA1C 6.6* 09/14/2011   Lab Results  Component Value Date   VITAMINB12 1370* 09/15/2011   Lab Results  Component Value Date   TSH 0.545 09/14/2011    08/15/12 MRI brain (without) - there are 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 Mild-moderate atrophy. Scattered chronic small vessel ischemic disease. Gliosis in the bilateral occipital lobes, possible from chronic ishemia  08/22/12 CT head - 2 left occipital and 1 cerebellar hyperdense lesions; cerebellar lesion is not mentioned on MRI  report.  09/12/12 MRI brain (with and without) - multiple enhancing lesions in the left frontal, left occipital, and bilateral cerebellar regions. Hemorrhagic components in the left occipital and midline cerebellar lesions. Findings suspicous for neoplastic process. Mild mass effect on 4th ventricle.   ASSESSMENT AND PLAN  67 y.o. year old male  has a past medical history of Anemia; Hypertension; Pneumonia (09/13/11); Exertional dyspnea; Sleep apnea (09/14/11); Type II diabetes mellitus; GERD (gastroesophageal reflux disease); Syncope and collapse (09/13/11); Kidney stones; and Arthritis. here with nausea, dizziness, and headaches since Feb 2014.  History, exam and neuroimaging studies are concerning for CNS neoplastic process. I think the patient should have malignancy screening of the chest abdomen and pelvis with CT scan. I agree with ongoing evaluation of enlarged lymph node which is planned to be biopsied. I discussed decadron usage, but patient requests to hold off for now. If symptoms worsen, then low threshold to start decadron to control cerebral edema.  I reviewed imaging studies myself, records, examined patient, and discussed plan with patient and other providers in the management of  this high-risk condition (CNS malignancy) requiring high complexity medical decision-making. I also showed the patient the MRI pictures on the computer and explained relevant findings.   Orders Placed This Encounter  Procedures  . CT Chest W Contrast  . CT Abdomen Pelvis W Contrast  . Ambulatory referral to Neurosurgery    Suanne Marker, MD 09/12/2012, 4:15 PM (with assistance from [facility] NP-C) Certified in Neurology, Neurophysiology and Neuroimaging  [facility] [address], [address] [place], [place] [address] [phone]

## 2012-09-12 NOTE — Patient Instructions (Signed)
I will order additional testing. 

## 2012-09-15 ENCOUNTER — Telehealth: Payer: Self-pay | Admitting: Diagnostic Neuroimaging

## 2012-09-15 ENCOUNTER — Telehealth: Payer: Self-pay | Admitting: *Deleted

## 2012-09-15 NOTE — Telephone Encounter (Signed)
I called and spoke to pt and then also to wif, re: appts for CT to coincide with Bx.  Unfortunately this cannot happen on the same day, due to NPo status.  I will call and speak with Lyla Son in scheduling re: location and when can be done.  Wife had questions re: where the bx is to be performed (she thought [facility] but now was questioning this).   I told her I would find out then let her know.

## 2012-09-15 NOTE — Telephone Encounter (Addendum)
Spoke to Palmer, in Radiology scheduling CT w/ contrast (abdomen, pelvis, chest).  Appointment is for 10-19-12 at 1830.  Wife notified of test and date and time.   I called and LMVM for nurse at Dr. Carolyn Stare re: BUN/CREAT.  To call back.  CT is at [facility] (first floor).  Pt to be NPO 4 hour prior to test.  Will have oral and IV contrast.  Will get contrast to take when in for BX on Wednesday.  She is to call for questions.

## 2012-09-16 ENCOUNTER — Telehealth: Payer: Self-pay | Admitting: Diagnostic Neuroimaging

## 2012-09-16 ENCOUNTER — Ambulatory Visit (HOSPITAL_COMMUNITY): Payer: Medicare Other

## 2012-09-16 DIAGNOSIS — R42 Dizziness and giddiness: Secondary | ICD-10-CM

## 2012-09-16 NOTE — Telephone Encounter (Signed)
I called pt and wife.   Pt did have last labs at [facility] on 06-16-12.  Still need BUN/Creat 6 wks prior to CT.  Will be there at [facility] for bx tomorrow and will have labs drawn then and p/u contrast.

## 2012-09-16 NOTE — Telephone Encounter (Signed)
Office Manager schedule appointment.

## 2012-09-17 ENCOUNTER — Ambulatory Visit (HOSPITAL_COMMUNITY): Admission: RE | Admit: 2012-09-17 | Payer: Medicare Other | Source: Ambulatory Visit

## 2012-09-17 ENCOUNTER — Ambulatory Visit (HOSPITAL_COMMUNITY)
Admission: RE | Admit: 2012-09-17 | Discharge: 2012-09-17 | Disposition: A | Discharge status: Home / Self Care | Payer: Medicare Other | Source: Ambulatory Visit | Attending: Otolaryngology | Admitting: Otolaryngology

## 2012-09-17 VITALS — BP 128/72 | HR 74 | Temp 97.2°F | Resp 16 | Ht 68.0 in | Wt 200.0 lb

## 2012-09-17 DIAGNOSIS — R112 Nausea with vomiting, unspecified: Secondary | ICD-10-CM | POA: Insufficient documentation

## 2012-09-17 DIAGNOSIS — R51 Headache: Secondary | ICD-10-CM | POA: Insufficient documentation

## 2012-09-17 DIAGNOSIS — D649 Anemia, unspecified: Secondary | ICD-10-CM | POA: Insufficient documentation

## 2012-09-17 DIAGNOSIS — R590 Localized enlarged lymph nodes: Secondary | ICD-10-CM

## 2012-09-17 DIAGNOSIS — B029 Zoster without complications: Secondary | ICD-10-CM | POA: Insufficient documentation

## 2012-09-17 DIAGNOSIS — C8581 Other specified types of non-Hodgkin lymphoma, lymph nodes of head, face, and neck: Secondary | ICD-10-CM | POA: Insufficient documentation

## 2012-09-17 DIAGNOSIS — G4730 Sleep apnea, unspecified: Secondary | ICD-10-CM | POA: Insufficient documentation

## 2012-09-17 DIAGNOSIS — R220 Localized swelling, mass and lump, head: Secondary | ICD-10-CM | POA: Insufficient documentation

## 2012-09-17 DIAGNOSIS — I10 Essential (primary) hypertension: Secondary | ICD-10-CM | POA: Insufficient documentation

## 2012-09-17 DIAGNOSIS — R599 Enlarged lymph nodes, unspecified: Secondary | ICD-10-CM | POA: Insufficient documentation

## 2012-09-17 DIAGNOSIS — R42 Dizziness and giddiness: Secondary | ICD-10-CM | POA: Insufficient documentation

## 2012-09-17 DIAGNOSIS — K219 Gastro-esophageal reflux disease without esophagitis: Secondary | ICD-10-CM | POA: Insufficient documentation

## 2012-09-17 DIAGNOSIS — R5381 Other malaise: Secondary | ICD-10-CM | POA: Insufficient documentation

## 2012-09-17 DIAGNOSIS — F17200 Nicotine dependence, unspecified, uncomplicated: Secondary | ICD-10-CM | POA: Insufficient documentation

## 2012-09-17 DIAGNOSIS — E119 Type 2 diabetes mellitus without complications: Secondary | ICD-10-CM | POA: Insufficient documentation

## 2012-09-17 LAB — CREATININE, SERUM: Creatinine, Ser: 0.92 mg/dL (ref 0.50–1.35)

## 2012-09-17 LAB — CBC
HCT: 34.3 % — ABNORMAL LOW (ref 39.0–52.0)
Hemoglobin: 11.1 g/dL — ABNORMAL LOW (ref 13.0–17.0)
MCV: 80.9 fL (ref 78.0–100.0)
Platelets: 295 10*3/uL (ref 150–400)
RBC: 4.24 MIL/uL (ref 4.22–5.81)
WBC: 7 10*3/uL (ref 4.0–10.5)

## 2012-09-17 LAB — BUN: BUN: 18 mg/dL (ref 6–23)

## 2012-09-17 MED ORDER — PROMETHAZINE HCL 25 MG/ML IJ SOLN: 6.2500 mg | Freq: Four times a day (QID) | INTRAMUSCULAR | Status: DC | PRN

## 2012-09-17 MED ORDER — PROMETHAZINE HCL 25 MG/ML IJ SOLN
6.2500 mg | Freq: Once | INTRAMUSCULAR | Status: AC
  Administered 2012-09-17: 6.25 mg via INTRAVENOUS
  Filled 2012-09-17: qty 1

## 2012-09-17 MED ORDER — MIDAZOLAM HCL 2 MG/2ML IJ SOLN
INTRAMUSCULAR | Status: AC | PRN
  Administered 2012-09-17: 1 mg via INTRAVENOUS

## 2012-09-17 MED ORDER — FENTANYL CITRATE 0.05 MG/ML IJ SOLN
INTRAMUSCULAR | Status: AC
  Filled 2012-09-17: qty 4

## 2012-09-17 MED ORDER — SODIUM CHLORIDE 0.9 % IV SOLN
Freq: Once | INTRAVENOUS | Status: AC
  Administered 2012-09-17: 13:00:00 via INTRAVENOUS

## 2012-09-17 MED ORDER — MIDAZOLAM HCL 2 MG/2ML IJ SOLN
INTRAMUSCULAR | Status: AC
  Filled 2012-09-17: qty 4

## 2012-09-17 MED ORDER — FENTANYL CITRATE 0.05 MG/ML IJ SOLN
INTRAMUSCULAR | Status: AC | PRN
  Administered 2012-09-17: 50 ug via INTRAVENOUS

## 2012-09-17 MED ORDER — PROMETHAZINE HCL 25 MG/ML IJ SOLN
INTRAMUSCULAR | Status: AC
  Filled 2012-09-17: qty 1

## 2012-09-17 NOTE — H&P (Signed)
Agree.  Has not had imaging so will check to see if left [place] node visible by ultrasound.

## 2012-09-17 NOTE — Telephone Encounter (Signed)
Order for BUN/Creat placed yesterday for pt to get when in today for biopsy.

## 2012-09-17 NOTE — ED Notes (Signed)
Patient denies pain and is resting comfortably.  

## 2012-09-17 NOTE — Procedures (Signed)
Procedure:  Ultrasound guided core biopsy of left supraclavicular/cervical lymph node. Findings:  Left [place] node core bx x 6 with 18 G core device.

## 2012-09-17 NOTE — H&P (Signed)
Aaron Kerr is an 67 y.o. male.   Chief Complaint: "I'm here for a biopsy on my neck" HPI: Patient with hx of norovirus infection 06/2012, temporal shingles 4 weeks ago and persistent episodes of nausea/vomiting/headaches/dizziness. Denies hx of cancer. Recent physical exam revealed left supraclavicular lymphadenopathy. Pt presents today for US guided biopsy of the left supraclavicular lymphadenopathy.  Past Medical History  Diagnosis Date  . Anemia   . Hypertension   . Pneumonia 09/13/11    "first time"  . Exertional dyspnea   . Sleep apnea 09/14/11    "wear breath rite strips"  . Type II diabetes mellitus   . GERD (gastroesophageal reflux disease)   . Syncope and collapse 09/13/11    "to weak to bear his own weight"  . Kidney stones   . Arthritis     "left hip"    Past Surgical History  Procedure Laterality Date  . Tonsillectomy      "as a child"  . Cardiac catheterization    . Carotid endarterectomy  05/15/2011    right  . Eye surgery    . Cataract extraction w/ intraocular lens  implant, bilateral  03/2011  . Hip surgery      Family History  Problem Relation Age of Onset  . Cancer Paternal Grandfather    Social History:  reports that he has been smoking Cigarettes.  He has a 37.5 pack-year smoking history. He has quit using smokeless tobacco. He reports that  drinks alcohol. He reports that he does not use illicit drugs.  Allergies:  Allergies  Allergen Reactions  . Meloxicam Swelling    Ankles became swollen    Current outpatient prescriptions:acetaminophen (TYLENOL) 500 MG tablet, Take 500-1,000 mg by mouth every 6 (six) hours as needed (headache)., Disp: , Rfl: ;  ALPRAZolam (XANAX) 1 MG tablet, Take 1 tablet (1 mg total) by mouth as needed for sleep (for premedication before MRI; may repeat x 1)., Disp: 2 tablet, Rfl: 0;  aspirin EC 81 MG tablet, Take 81 mg by mouth at bedtime. , Disp: , Rfl:  clopidogrel (PLAVIX) 75 MG tablet, Take 75 mg by mouth every morning. ,  Disp: , Rfl: ;  irbesartan (AVAPRO) 300 MG tablet, Take 300 mg by mouth every morning. , Disp: , Rfl: ;  metFORMIN (GLUCOPHAGE-XR) 500 MG 24 hr tablet, Take 1,000 mg by mouth 2 (two) times daily with a meal., Disp: , Rfl: ;  pantoprazole (PROTONIX) 40 MG tablet, Take 40 mg by mouth daily as needed (indigestion)., Disp: , Rfl:  promethazine (PHENERGAN) 25 MG tablet, Take 25 mg by mouth every 6 (six) hours as needed for nausea., Disp: , Rfl:  Current facility-administered medications:promethazine (PHENERGAN) 25 MG/ML injection, , , ,    Results for orders placed during the hospital encounter of 09/17/12 (from the past 48 hour(s))  APTT     Status: None   Collection Time    09/17/12  1:05 PM      Result Value Range   aPTT 32  24 - 37 seconds  CBC     Status: Abnormal   Collection Time    09/17/12  1:05 PM      Result Value Range   WBC 7.0  4.0 - 10.5 K/uL   RBC 4.24  4.22 - 5.81 MIL/uL   Hemoglobin 11.1 (*) 13.0 - 17.0 g/dL   HCT 40.9 (*) 81.1 - 91.4 %   MCV 80.9  78.0 - 100.0 fL   MCH 26.2  26.0 -  34.0 pg   MCHC 32.4  30.0 - 36.0 g/dL   RDW 78.2  95.6 - 21.3 %   Platelets 295  150 - 400 K/uL  PROTIME-INR     Status: None   Collection Time    09/17/12  1:05 PM      Result Value Range   Prothrombin Time 13.3  11.6 - 15.2 seconds   INR 1.02  0.00 - 1.49   No results found.  Review of Systems  Constitutional: Positive for malaise/fatigue. Negative for fever and chills.  Respiratory: Negative for shortness of breath.        Occ cough  Cardiovascular: Negative for chest pain.  Gastrointestinal: Positive for nausea and vomiting. Negative for abdominal pain.  Genitourinary: Negative for hematuria.  Musculoskeletal: Negative for back pain.  Neurological: Positive for dizziness, weakness and headaches.  Endo/Heme/Allergies: Does not bruise/bleed easily.    Blood pressure 128/75, pulse 78, temperature 97.2 F (36.2 C), temperature source Oral, resp. rate 18, height 5\' 8"  (1.727 m),  weight 200 lb (90.719 kg), SpO2 96.00%. Physical Exam  Constitutional: He is oriented to person, place, and time. He appears well-developed and well-nourished.  Neck:  Palpable small lymph nodes left supraclavicular region,NT  Cardiovascular: Normal rate and regular rhythm.   Respiratory: Effort normal and breath sounds normal.  GI: Soft. Bowel sounds are normal. There is no tenderness.  Musculoskeletal: Normal range of motion. He exhibits no edema.  Neurological: He is alert and oriented to person, place, and time.     Assessment/Plan Pt with palpable left supraclavicular lymph nodes by clinical exam and recent hx of shingles/norovirus but no malignancy. Plan is for US guided biopsy of the left supraclavicular lymph node(s) today. Details/risks of procedure d/w pt/family with their understanding and consent.  ALLRED,D KEVIN 09/17/2012, 1:59 PM

## 2012-09-18 ENCOUNTER — Encounter (HOSPITAL_COMMUNITY): Payer: Self-pay

## 2012-09-18 ENCOUNTER — Telehealth: Payer: Self-pay | Admitting: Diagnostic Neuroimaging

## 2012-09-18 ENCOUNTER — Ambulatory Visit (HOSPITAL_COMMUNITY)
Admission: RE | Admit: 2012-09-18 | Discharge: 2012-09-18 | Disposition: A | Discharge status: Home / Self Care | Payer: Medicare Other | Source: Ambulatory Visit | Attending: Diagnostic Neuroimaging | Admitting: Diagnostic Neuroimaging

## 2012-09-18 DIAGNOSIS — C799 Secondary malignant neoplasm of unspecified site: Secondary | ICD-10-CM

## 2012-09-18 DIAGNOSIS — Z129 Encounter for screening for malignant neoplasm, site unspecified: Secondary | ICD-10-CM

## 2012-09-18 DIAGNOSIS — D496 Neoplasm of unspecified behavior of brain: Secondary | ICD-10-CM

## 2012-09-18 DIAGNOSIS — R112 Nausea with vomiting, unspecified: Secondary | ICD-10-CM

## 2012-09-18 DIAGNOSIS — N200 Calculus of kidney: Secondary | ICD-10-CM

## 2012-09-18 MED ORDER — IOHEXOL 300 MG/ML  SOLN
100.0000 mL | Freq: Once | INTRAMUSCULAR | Status: AC | PRN
  Administered 2012-09-18: 100 mL via INTRAVENOUS

## 2012-09-18 NOTE — Telephone Encounter (Signed)
I called wife back about referral request.  She is requesting to go to [facility] for NS referral due to pt being seen there before for hip surgery.   She said the hip surgeon was going to make referral.  I told her that we can send records with  A release form signed by pt.  Along with any CD's.  The test for the biopsy will be given to them by Dr. Pollyann Kennedy and the CT pelvis/abd will be done by Dr. Marjory Lies.

## 2012-09-19 ENCOUNTER — Telehealth: Payer: Self-pay | Admitting: Cardiovascular Disease

## 2012-09-19 NOTE — Telephone Encounter (Signed)
Patients spouse called requesting that patients doppler be faxed to April at [facility] Neurosurgery dept. ST 09-19-12.

## 2012-09-22 ENCOUNTER — Other Ambulatory Visit (HOSPITAL_BASED_OUTPATIENT_CLINIC_OR_DEPARTMENT_OTHER): Payer: Self-pay | Admitting: Obstetrics and Gynecology

## 2012-09-23 ENCOUNTER — Telehealth: Payer: Self-pay | Admitting: Diagnostic Neuroimaging

## 2012-09-23 NOTE — Telephone Encounter (Signed)
Noted  

## 2012-09-25 ENCOUNTER — Telehealth: Payer: Self-pay | Admitting: Diagnostic Neuroimaging

## 2012-09-25 NOTE — Telephone Encounter (Signed)
I called patient. Reviewed results. 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  He has met with neurosurgery and oncology at [facility] yesterday and is proceeding with treatment there.  Suanne Marker, MD 09/25/2012, 9:17 AM Certified in Neurology, Neurophysiology and Neuroimaging  [facility] [address] [place], [place] [address] [phone]

## 2012-11-26 ENCOUNTER — Encounter: Payer: Self-pay | Admitting: Diagnostic Neuroimaging

## 2013-01-19 ENCOUNTER — Other Ambulatory Visit (HOSPITAL_COMMUNITY): Payer: Self-pay | Admitting: Cardiovascular Disease

## 2013-01-21 ENCOUNTER — Telehealth: Payer: Self-pay | Admitting: Cardiovascular Disease

## 2013-01-21 NOTE — Telephone Encounter (Signed)
Please call refill for Plavix 75 mg (generic) to [facility] on [address].  This request was faxed to [place] on Monday of this week.

## 2013-01-23 NOTE — Telephone Encounter (Signed)
Refills sent to pharmacy. 

## 2013-02-26 ENCOUNTER — Telehealth (HOSPITAL_COMMUNITY): Payer: Self-pay | Admitting: *Deleted

## 2013-02-26 NOTE — Telephone Encounter (Signed)
Pt is unable to come in for q6 carotid doppler test because he has terminal cancer. Pts wife wants to know if Dr. Allyson Sabal will continue to fill his Plavix even though he is physically unable to come into the office now.

## 2013-03-06 ENCOUNTER — Telehealth: Payer: Self-pay | Admitting: Cardiovascular Disease

## 2013-03-06 NOTE — Telephone Encounter (Signed)
Has question about phone call from this am about not taking his Plavix.

## 2013-03-06 NOTE — Telephone Encounter (Signed)
Dr Allyson Sabal reviewed the chart and said that Mr Beitler can stop his plavix all together.  I called patient and made him aware.

## 2013-03-06 NOTE — Telephone Encounter (Signed)
Returned patient's wife's phone call - she wanted clarification/verification that patient was ok to stop plavix. Informed her documentation of Samara Deist, RN that is ok to stop. Wife also stated patient has not been on asa 70 for about 2 months due to chemo for terminal cancer. RN told wife would make Samara Deist aware.

## 2013-03-12 NOTE — Telephone Encounter (Signed)
I made Dr Allyson Sabal aware and he is okay with that.

## 2013-04-13 ENCOUNTER — Telehealth (HOSPITAL_COMMUNITY): Payer: Self-pay | Admitting: *Deleted

## 2013-04-16 ENCOUNTER — Telehealth (HOSPITAL_COMMUNITY): Payer: Self-pay | Admitting: *Deleted

## 2013-06-19 IMAGING — CR DG CHEST 2V
1 series · 1 of 1 positions shown · non-contrast
Comparison: 05/11/2011

CLINICAL DATA: Weakness for few days.  Cough.  Shortness of breath.
Fever.  Diabetes.  Hypertension.  Smoker.

CHEST - 2 VIEW

[w chest lat]
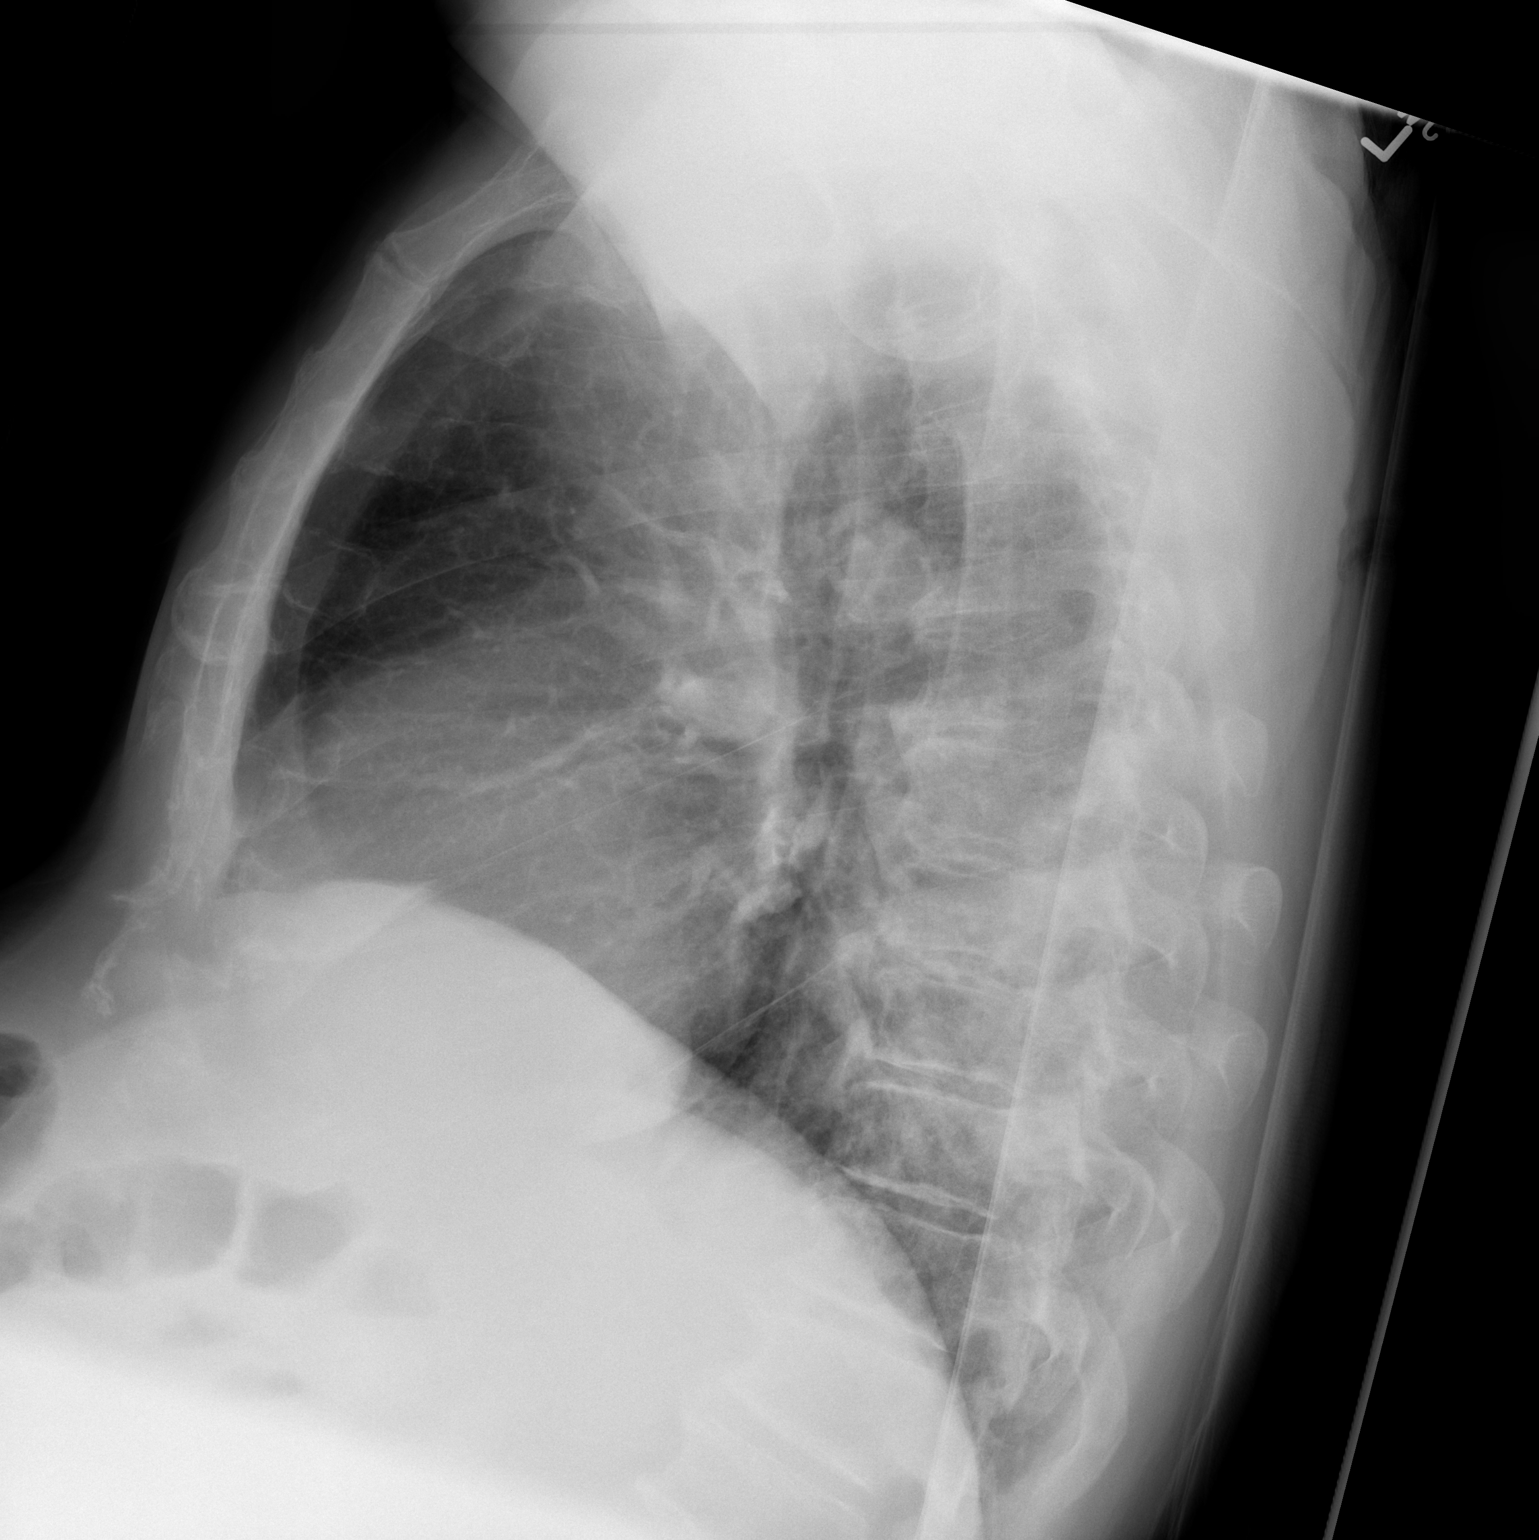

[1 of 1 positions shown; findings below may reference images not displayed]

FINDINGS: AP and lateral views.  Mild hyperinflation.  Midline
trachea.  Mild cardiomegaly. Mediastinal contours otherwise within
normal limits.  No pleural effusion or pneumothorax.  Diffuse
peribronchial thickening.  Increased density projecting over the
left heart border and left lung base may correspond to increased
posterior density on the lateral.  This area is suboptimally
evaluated due to artifact on the lateral.
IMPRESSION: 1.  COPD/chronic bronchitis, mild.
2.  Cannot exclude left lower lobe airspace disease/pneumonia.
Possible increased density on frontal corresponding to increased
posterior density on lateral.  Of note, lateral view is degraded
due to overlying artifact.  Presuming left lower lobe pneumonia is
a clinical concern, potential clinical strategies would include
antibiotic therapy and short-term follow-up versus full PA and
lateral views without artifact.

## 2013-06-24 ENCOUNTER — Emergency Department (HOSPITAL_BASED_OUTPATIENT_CLINIC_OR_DEPARTMENT_OTHER): Payer: Medicare Other

## 2013-06-24 ENCOUNTER — Emergency Department (HOSPITAL_BASED_OUTPATIENT_CLINIC_OR_DEPARTMENT_OTHER)
Admission: EM | Admit: 2013-06-24 | Discharge: 2013-06-24 | Disposition: A | Discharge status: Other Acute Inpatient Hospital | Payer: Medicare Other | Attending: Emergency Medicine | Admitting: Emergency Medicine

## 2013-06-24 ENCOUNTER — Encounter (HOSPITAL_BASED_OUTPATIENT_CLINIC_OR_DEPARTMENT_OTHER): Payer: Self-pay | Admitting: Emergency Medicine

## 2013-06-24 DIAGNOSIS — F29 Unspecified psychosis not due to a substance or known physiological condition: Secondary | ICD-10-CM | POA: Insufficient documentation

## 2013-06-24 DIAGNOSIS — Z8701 Personal history of pneumonia (recurrent): Secondary | ICD-10-CM | POA: Diagnosis not present

## 2013-06-24 DIAGNOSIS — K219 Gastro-esophageal reflux disease without esophagitis: Secondary | ICD-10-CM | POA: Diagnosis not present

## 2013-06-24 DIAGNOSIS — Z9889 Other specified postprocedural states: Secondary | ICD-10-CM | POA: Diagnosis not present

## 2013-06-24 DIAGNOSIS — R0902 Hypoxemia: Secondary | ICD-10-CM | POA: Diagnosis not present

## 2013-06-24 DIAGNOSIS — I10 Essential (primary) hypertension: Secondary | ICD-10-CM | POA: Insufficient documentation

## 2013-06-24 DIAGNOSIS — Z7982 Long term (current) use of aspirin: Secondary | ICD-10-CM | POA: Diagnosis not present

## 2013-06-24 DIAGNOSIS — D649 Anemia, unspecified: Secondary | ICD-10-CM | POA: Diagnosis not present

## 2013-06-24 DIAGNOSIS — Z87442 Personal history of urinary calculi: Secondary | ICD-10-CM | POA: Insufficient documentation

## 2013-06-24 DIAGNOSIS — E119 Type 2 diabetes mellitus without complications: Secondary | ICD-10-CM | POA: Diagnosis not present

## 2013-06-24 DIAGNOSIS — M549 Dorsalgia, unspecified: Secondary | ICD-10-CM

## 2013-06-24 DIAGNOSIS — Z79899 Other long term (current) drug therapy: Secondary | ICD-10-CM | POA: Diagnosis not present

## 2013-06-24 DIAGNOSIS — M1610 Unilateral primary osteoarthritis, unspecified hip: Secondary | ICD-10-CM | POA: Insufficient documentation

## 2013-06-24 DIAGNOSIS — Z87891 Personal history of nicotine dependence: Secondary | ICD-10-CM | POA: Insufficient documentation

## 2013-06-24 DIAGNOSIS — IMO0002 Reserved for concepts with insufficient information to code with codable children: Secondary | ICD-10-CM | POA: Diagnosis not present

## 2013-06-24 DIAGNOSIS — C3490 Malignant neoplasm of unspecified part of unspecified bronchus or lung: Secondary | ICD-10-CM

## 2013-06-24 DIAGNOSIS — Z85841 Personal history of malignant neoplasm of brain: Secondary | ICD-10-CM | POA: Diagnosis not present

## 2013-06-24 DIAGNOSIS — G4730 Sleep apnea, unspecified: Secondary | ICD-10-CM | POA: Insufficient documentation

## 2013-06-24 HISTORY — DX: Malignant neoplasm of unspecified part of unspecified bronchus or lung: C34.90

## 2013-06-24 HISTORY — DX: Malignant (primary) neoplasm, unspecified: C80.1

## 2013-06-24 HISTORY — DX: Malignant neoplasm of brain, unspecified: C71.9

## 2013-06-24 LAB — COMPREHENSIVE METABOLIC PANEL
ALK PHOS: 90 U/L (ref 39–117)
ALT: 11 U/L (ref 0–53)
AST: 31 U/L (ref 0–37)
Albumin: 3.7 g/dL (ref 3.5–5.2)
BUN: 32 mg/dL — AB (ref 6–23)
CO2: 23 mEq/L (ref 19–32)
CREATININE: 1.3 mg/dL (ref 0.50–1.35)
Calcium: 9.3 mg/dL (ref 8.4–10.5)
Chloride: 98 mEq/L (ref 96–112)
GFR calc non Af Amer: 55 mL/min — ABNORMAL LOW (ref >90)
GFR, EST AFRICAN AMERICAN: 64 mL/min — AB (ref >90)
GLUCOSE: 153 mg/dL — AB (ref 70–99)
POTASSIUM: 4.7 meq/L (ref 3.7–5.3)
Sodium: 136 mEq/L — ABNORMAL LOW (ref 137–147)
TOTAL PROTEIN: 7.3 g/dL (ref 6.0–8.3)
Total Bilirubin: 0.7 mg/dL (ref 0.3–1.2)

## 2013-06-24 LAB — POCT I-STAT 3, ART BLOOD GAS (G3+)
Acid-base deficit: 1 mmol/L (ref 0.0–2.0)
Bicarbonate: 23.3 mEq/L (ref 20.0–24.0)
O2 SAT: 90 %
PCO2 ART: 35.5 mmHg (ref 35.0–45.0)
PO2 ART: 56 mmHg — AB (ref 80.0–100.0)
Patient temperature: 97.5
TCO2: 24 mmol/L (ref 0–100)
pH, Arterial: 7.423 (ref 7.350–7.450)

## 2013-06-24 LAB — CBC WITH DIFFERENTIAL/PLATELET
Basophils Absolute: 0 10*3/uL (ref 0.0–0.1)
Basophils Relative: 0 % (ref 0–1)
EOS ABS: 0 10*3/uL (ref 0.0–0.7)
EOS PCT: 0 % (ref 0–5)
HEMATOCRIT: 32.2 % — AB (ref 39.0–52.0)
HEMOGLOBIN: 10.1 g/dL — AB (ref 13.0–17.0)
LYMPHS ABS: 0.5 10*3/uL — AB (ref 0.7–4.0)
Lymphocytes Relative: 5 % — ABNORMAL LOW (ref 12–46)
MCH: 28.8 pg (ref 26.0–34.0)
MCHC: 31.4 g/dL (ref 30.0–36.0)
MCV: 91.7 fL (ref 78.0–100.0)
MONO ABS: 0.7 10*3/uL (ref 0.1–1.0)
MONOS PCT: 6 % (ref 3–12)
Neutro Abs: 9.2 10*3/uL — ABNORMAL HIGH (ref 1.7–7.7)
Neutrophils Relative %: 88 % — ABNORMAL HIGH (ref 43–77)
Platelets: 213 10*3/uL (ref 150–400)
RBC: 3.51 MIL/uL — AB (ref 4.22–5.81)
RDW: 17.3 % — ABNORMAL HIGH (ref 11.5–15.5)
WBC: 10.4 10*3/uL (ref 4.0–10.5)

## 2013-06-24 LAB — URINALYSIS, ROUTINE W REFLEX MICROSCOPIC
Bilirubin Urine: NEGATIVE
Glucose, UA: NEGATIVE mg/dL
Hgb urine dipstick: NEGATIVE
KETONES UR: 15 mg/dL — AB
LEUKOCYTES UA: NEGATIVE
NITRITE: NEGATIVE
PH: 5 (ref 5.0–8.0)
PROTEIN: NEGATIVE mg/dL
Specific Gravity, Urine: 1.036 — ABNORMAL HIGH (ref 1.005–1.030)
Urobilinogen, UA: 0.2 mg/dL (ref 0.0–1.0)

## 2013-06-24 LAB — CG4 I-STAT (LACTIC ACID): Lactic Acid, Venous: 0.6 mmol/L (ref 0.5–2.2)

## 2013-06-24 LAB — AMMONIA: Ammonia: 20 umol/L (ref 11–60)

## 2013-06-24 LAB — GLUCOSE, CAPILLARY: Glucose-Capillary: 144 mg/dL — ABNORMAL HIGH (ref 70–99)

## 2013-06-24 MED ORDER — LORAZEPAM 2 MG/ML IJ SOLN
1.0000 mg | Freq: Once | INTRAMUSCULAR | Status: AC
  Administered 2013-06-24: 1 mg via INTRAVENOUS

## 2013-06-24 MED ORDER — HYDROMORPHONE HCL PF 1 MG/ML IJ SOLN
INTRAMUSCULAR | Status: AC
  Filled 2013-06-24: qty 1

## 2013-06-24 MED ORDER — SODIUM CHLORIDE 0.9 % IV SOLN
Freq: Once | INTRAVENOUS | Status: AC
  Administered 2013-06-24: 13:00:00 via INTRAVENOUS

## 2013-06-24 MED ORDER — DEXTROSE 5 % IV SOLN: 1.0000 g | INTRAVENOUS | Status: DC

## 2013-06-24 MED ORDER — IOHEXOL 350 MG/ML SOLN
80.0000 mL | Freq: Once | INTRAVENOUS | Status: AC | PRN
  Administered 2013-06-24: 80 mL via INTRAVENOUS

## 2013-06-24 MED ORDER — HYDROMORPHONE HCL PF 1 MG/ML IJ SOLN
1.0000 mg | Freq: Once | INTRAMUSCULAR | Status: AC
  Administered 2013-06-24: 1 mg via INTRAVENOUS

## 2013-06-24 MED ORDER — LORAZEPAM 2 MG/ML IJ SOLN
0.5000 mg | Freq: Once | INTRAMUSCULAR | Status: AC
  Administered 2013-06-24: 0.5 mg via INTRAVENOUS
  Filled 2013-06-24: qty 1

## 2013-06-24 MED ORDER — AZITHROMYCIN 500 MG IV SOLR: 500.0000 mg | INTRAVENOUS | Status: DC

## 2013-06-24 MED ORDER — NALOXONE HCL 0.4 MG/ML IJ SOLN
0.4000 mg | Freq: Once | INTRAMUSCULAR | Status: AC
  Administered 2013-06-24: 0.4 mg via INTRAVENOUS

## 2013-06-24 MED ORDER — NALOXONE HCL 0.4 MG/ML IJ SOLN
INTRAMUSCULAR | Status: AC
  Filled 2013-06-24: qty 1

## 2013-06-24 MED ORDER — SODIUM CHLORIDE 0.9 % IV BOLUS (SEPSIS)
1000.0000 mL | Freq: Once | INTRAVENOUS | Status: AC
  Administered 2013-06-24: 1000 mL via INTRAVENOUS

## 2013-06-24 MED ORDER — LORAZEPAM 2 MG/ML IJ SOLN
INTRAMUSCULAR | Status: AC
  Filled 2013-06-24: qty 1

## 2013-06-24 NOTE — ED Notes (Signed)
Pt became more alert when in to advise of need for I&O cath-pt attempting to get out of bed-pt had not been incontinent of urine-bladder drained-300cc urine return-pt FROM MAE x 4 with EMT assist to hold pt for cath-pt repositioned in up right position-pt pulled mask off face-[place] at 4 L placed with pt advisement to leave in place-family at BS-advised we may be able to leave [place] on vs mask unless pt's sats start dropping again when sleeps

## 2013-06-24 NOTE — ED Notes (Signed)
cont'd no change in status-minimal eye open attempt response when name called-family has taken belonging and will call back for bed assignment

## 2013-06-24 NOTE — ED Notes (Signed)
Pt has history of stage 4 lung and brain cancer. Complaining of severe back and increased confusion.

## 2013-06-24 NOTE — ED Notes (Signed)
PA at bedside discussing transfer details.

## 2013-06-24 NOTE — ED Notes (Signed)
No change in status.

## 2013-06-24 NOTE — ED Notes (Signed)
Called back into pt's room by family-pt continues to attempt to get out of bed-family requested "something to calm him down"-EDPA notified

## 2013-06-24 NOTE — ED Provider Notes (Signed)
Patient with lung cancer, history of brain mets, apparently improved, with back pain and confusion per family. Arrived agitated per report.  Patient received dilaudid and ativan prior to my evaluation and is now obtunded. He responds to pain and protects his airway. He was found to have shallow breathing and hypoxia which improved with O2. Narcan given without change. CT head without hemorrhage. CXR clear, no evidence of PE.  Unclear source of hypoxia, PO2 59 on ABG, no CO2 retention.  Afebrile, no evidence of infection. Family reports patient has made no end of life decisions. Advised he does not need intubation now but may require it if mental status does not improve. However, he appears to be a poor candidate for intubation or intensive life support given his metastatic cancer.  Workup does not show significant infectious or metabolic abnormality. Will need admission for encephalopathy as well as further evaluation of hypoxia.  CRITICAL CARE Performed by: Ezequiel Essex Total critical care time: 30 Critical care time was exclusive of separately billable procedures and treating other patients. Critical care was necessary to treat or prevent imminent or life-threatening deterioration. Critical care was time spent personally by me on the following activities: development of treatment plan with patient and/or surrogate as well as nursing, discussions with consultants, evaluation of patient's response to treatment, examination of patient, obtaining history from patient or surrogate, ordering and performing treatments and interventions, ordering and review of laboratory studies, ordering and review of radiographic studies, pulse oximetry and re-evaluation of patient's condition.   Ezequiel Essex, MD 06/24/13 2138

## 2013-06-24 NOTE — ED Provider Notes (Signed)
CSN: [id number]     Arrival date & time 06/24/13  1132 History   First MD Initiated Contact with Patient 06/24/13 1210     Chief Complaint  Patient presents with  . Back Pain  . Dehydration     (Consider location/radiation/quality/duration/timing/severity/associated sxs/prior Treatment) Patient is a 68 y.o. male presenting with back pain. The history is provided by the patient. No language interpreter was used.  Back Pain Location:  Generalized Quality:  Aching Radiates to:  Does not radiate Pain severity:  Severe Pain is:  Same all the time Onset quality:  Gradual Progression:  Worsening Chronicity:  New Relieved by:  Nothing Worsened by:  Nothing tried Ineffective treatments:  None tried Associated symptoms: abdominal pain   Pt complains of severe back pain/   Pt reports pt is very confused and agitatted.   Family reports pt has lung and brain cancer.   Pt took percocet with no relief.  Pt rtrying to get out of bed,  Confused.   Pt complains of severe pain  Past Medical History  Diagnosis Date  . Anemia   . Hypertension   . Pneumonia 09/13/11    "first time"  . Exertional dyspnea   . Sleep apnea 09/14/11    "wear breath [employer] strips"  . Type II diabetes mellitus   . GERD (gastroesophageal reflux disease)   . Syncope and collapse 09/13/11    "to weak to bear his own weight"  . Kidney stones   . Arthritis     "left hip"  . Cancer   . Lung cancer   . Brain cancer    Past Surgical History  Procedure Laterality Date  . Tonsillectomy      "as a child"  . Cardiac catheterization    . Carotid endarterectomy  05/15/2011    right  . Eye surgery    . Cataract extraction w/ intraocular lens  implant, bilateral  03/2011  . Hip surgery     Family History  Problem Relation Age of Onset  . Cancer Paternal Grandfather    History  Substance Use Topics  . Smoking status: Former Smoker -- 2.00 packs/day for 50 years  . Smokeless tobacco: Former Systems developer  . Alcohol Use: No     Review of Systems  Gastrointestinal: Positive for abdominal pain.  Musculoskeletal: Positive for back pain.  All other systems reviewed and are negative.      Allergies  Meloxicam; Compazine; Phenergan; and Zofran  Home Medications   Current Outpatient Rx  Name  Route  Sig  Dispense  Refill  . acetaminophen (TYLENOL) 500 MG tablet   Oral   Take 500-1,000 mg by mouth every 6 (six) hours as needed (headache).         Marland Kitchen aspirin EC 81 MG tablet   Oral   Take 81 mg by mouth at bedtime.          Marland Kitchen dexamethasone (DECADRON) 4 MG tablet   Oral   Take 4 mg by mouth daily.         . folic acid (FOLVITE) 1 MG tablet   Oral   Take 1 mg by mouth daily.         . irbesartan (AVAPRO) 300 MG tablet   Oral   Take 300 mg by mouth every morning.          . metFORMIN (GLUCOPHAGE-XR) 500 MG 24 hr tablet   Oral   Take 1,000 mg by mouth 2 (two) times daily with  a meal.         . oxycodone (OXY-IR) 5 MG capsule   Oral   Take 5 mg by mouth every 4 (four) hours as needed for pain.         . pantoprazole (PROTONIX) 40 MG tablet   Oral   Take 40 mg by mouth daily as needed (indigestion).         . ALPRAZolam (XANAX) 1 MG tablet   Oral   Take 1 tablet (1 mg total) by mouth as needed for sleep (for premedication before MRI; may repeat x 1).   2 tablet   0   . promethazine (PHENERGAN) 25 MG tablet   Oral   Take 25 mg by mouth every 6 (six) hours as needed for nausea.          BP 94/63  Pulse 85  Temp(Src) 97.5 F (36.4 C) (Oral)  Resp 16  SpO2 100% Physical Exam  Nursing note and vitals reviewed. Constitutional: He appears well-developed and well-nourished.  HENT:  Head: Normocephalic.  Right Ear: External ear normal.  Left Ear: External ear normal.  Mouth/Throat: Oropharynx is clear and moist.  Eyes: Conjunctivae are normal.  Neck: Normal range of motion.  Cardiovascular: Normal rate and normal heart sounds.   Pulmonary/Chest: Effort normal.   Abdominal: Soft.  Musculoskeletal: Normal range of motion.  Neurological: He has normal reflexes.  Confused, combative  Skin: Skin is warm.  Psychiatric: He has a normal mood and affect.    ED Course  Procedures (including critical care time) Labs Review Labs Reviewed  CBC WITH DIFFERENTIAL - Abnormal; Notable for the following:    RBC 3.51 (*)    Hemoglobin 10.1 (*)    HCT 32.2 (*)    RDW 17.3 (*)    Neutrophils Relative % 88 (*)    Neutro Abs 9.2 (*)    Lymphocytes Relative 5 (*)    Lymphs Abs 0.5 (*)    All other components within normal limits  COMPREHENSIVE METABOLIC PANEL - Abnormal; Notable for the following:    Sodium 136 (*)    Glucose, Bld 153 (*)    BUN 32 (*)    GFR calc non Af Amer 55 (*)    GFR calc Af Amer 64 (*)    All other components within normal limits  URINALYSIS, ROUTINE W REFLEX MICROSCOPIC  AMMONIA   Imaging Review No results found.  EKG Interpretation   None       MDM   Final diagnoses:  Back pain  Hypoxia  Lung cancer    Pt given ativan and dilaudid.   Dr. Wyvonnia Dusky in to see and examine.  Pt very sleepy after medications,   Pt decreased respiration.  Pt given narcan.   Pt had improved respiration.   02 continues to be low decreases to 70's.   Pt placed on 02 vent mask.  Ct head no acute abnormality,  Ct angio shows increasing lymphadenopathy.   There is alo a new lucency at T7 concerning for metastic lesion.   (this area could explain pt's severe back pain   I spoke to Dr. Maylon Peppers at [facility] who will admit  Fransico Meadow, PA-C 06/24/13 1805  8;00p  Pt awake and combative.  Pt removing oxygen, trying to get out of bed.   I will give lower dose of ativan.   0.5.   Pt is still confused.    [place], PA-C 06/24/13 2023

## 2013-06-24 NOTE — ED Provider Notes (Signed)
Medical screening examination/treatment/procedure(s) were conducted as a shared visit with non-physician practitioner(s) and myself.  I personally evaluated the patient during the encounter.  See my additional note.  EKG Interpretation   None        Ezequiel Essex, MD 06/24/13 2139

## 2013-06-24 NOTE — ED Notes (Signed)
EDPA at [facility] for pt assessment

## 2013-06-24 NOTE — ED Notes (Signed)
Pt's sats dropped to high 80s again with sedation and mouth breathing switched back to [employer] back up to mid-high 90s

## 2013-06-24 NOTE — ED Notes (Signed)
EDP Rancour advised pt sats dropped to mid 60s-he placed pt on O2-sats came up to mid-high 90s on O2 4 LNC-EDP with orders for narcan

## 2013-06-24 NOTE — ED Notes (Signed)
Pt cont'd sleeping-minimal response to lab stick

## 2013-06-24 NOTE — ED Notes (Signed)
Pt sats dropping to 70s-pt is mouth breathing-O2 switched to 50% Venti mask-EDP Rancour and at [facility]

## 2013-06-24 NOTE — ED Notes (Signed)
Pt returned to drowsy state drowsy-responds to name with eye opening

## 2013-07-05 DEATH — deceased

## 2014-04-15 ENCOUNTER — Encounter (HOSPITAL_COMMUNITY): Payer: Self-pay | Admitting: Cardiovascular Disease

## 2014-05-20 ENCOUNTER — Encounter (HOSPITAL_COMMUNITY): Payer: Self-pay | Admitting: Cardiovascular Disease

## 2014-11-24 ENCOUNTER — Encounter: Payer: Self-pay | Admitting: *Deleted

## 2015-01-04 ENCOUNTER — Encounter: Payer: Self-pay | Admitting: Cardiovascular Disease

## 2015-03-31 IMAGING — CT CT ANGIO CHEST
2 of 6 series · 18 of 36 positions shown · IV contrast (APPLIED)
Comparison: Chest CT scan 09/18/2012.

CLINICAL DATA: Hypoxia.  History of lung cancer.

EXAM:
CT ANGIOGRAPHY CHEST WITH CONTRAST
TECHNIQUE: Multidetector CT imaging of the chest was performed using the
standard protocol during bolus administration of intravenous
contrast. Multiplanar CT image reconstructions and MIPs were
obtained to evaluate the vascular anatomy.
CONTRAST:  80 mL OMNIPAQUE IOHEXOL 350 MG/ML SOLN

[Series 6: pe 1.0 b26f · axial · 0.67mm/px · z∈[-60,+172]mm · 17 of 258 slices shown]
[im 13/258  lung]
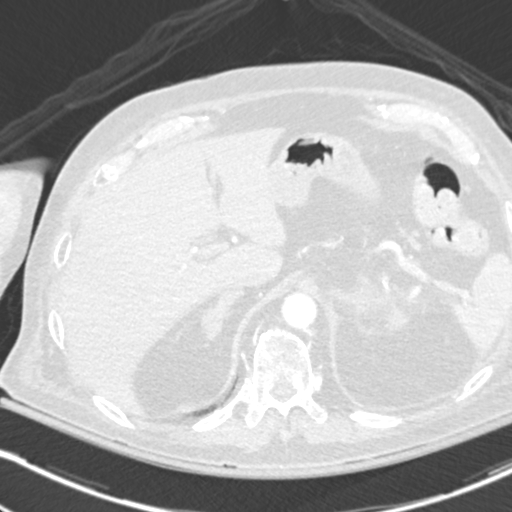
[im 26/258  mediastinal]
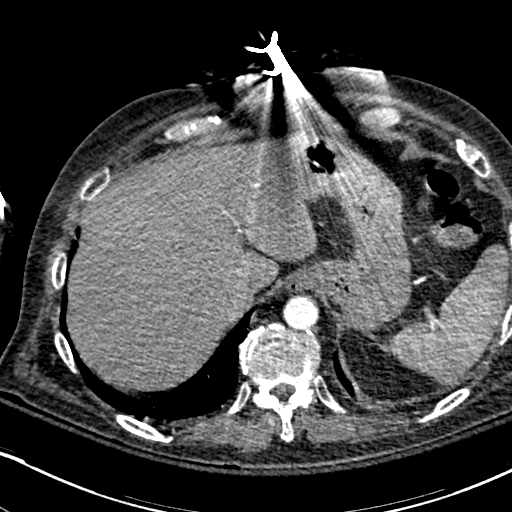
[im 39/258  lung]
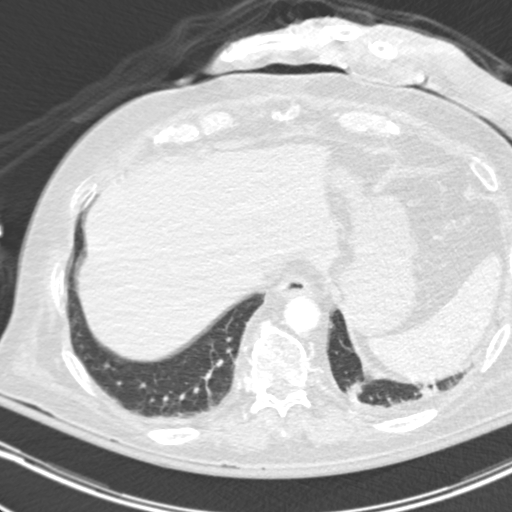
[im 52/258  mediastinal]
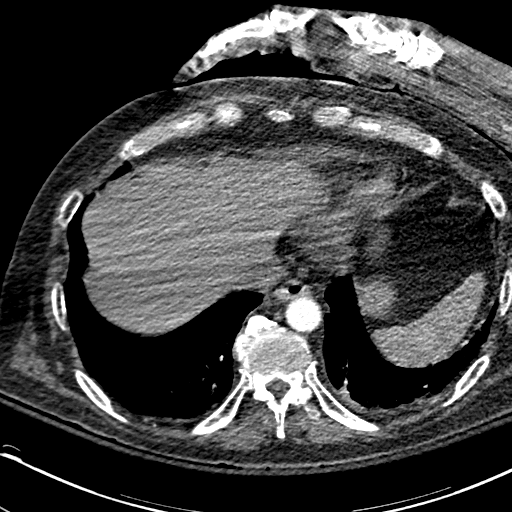
[im 78/258  lung]
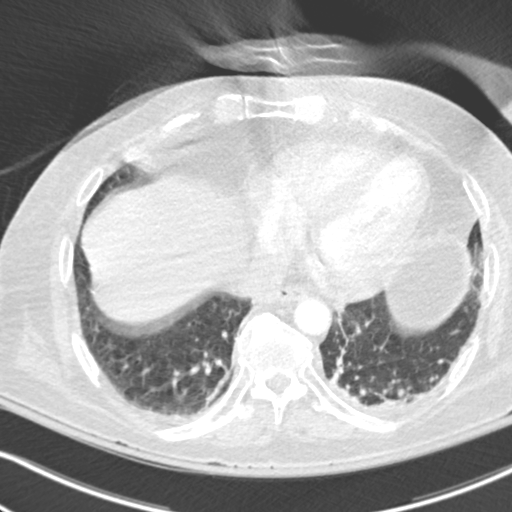
[im 90/258  mediastinal]
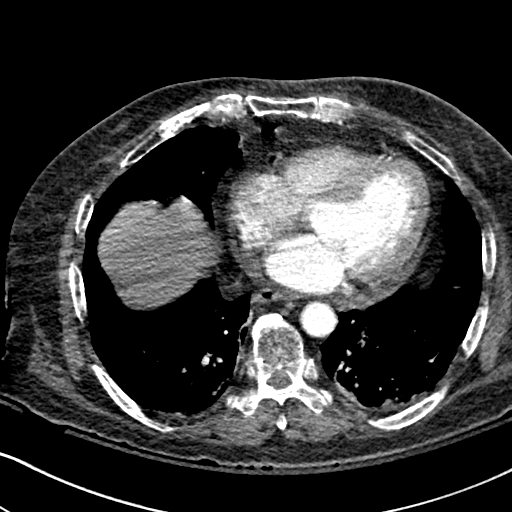
[im 103/258  lung]
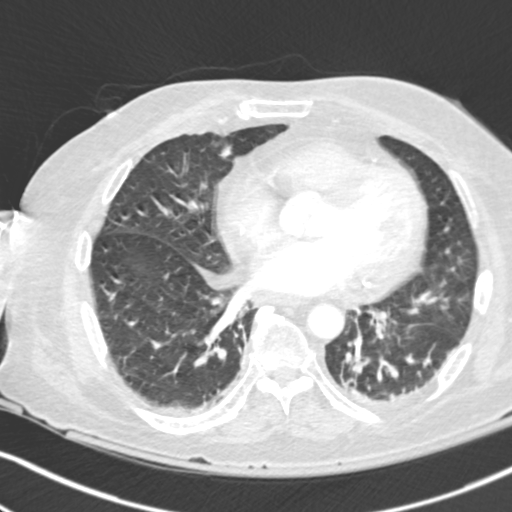
[im 116/258  mediastinal]
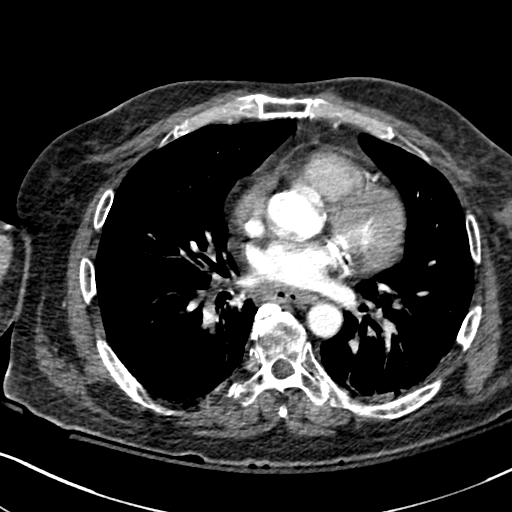
[im 129/258  lung]
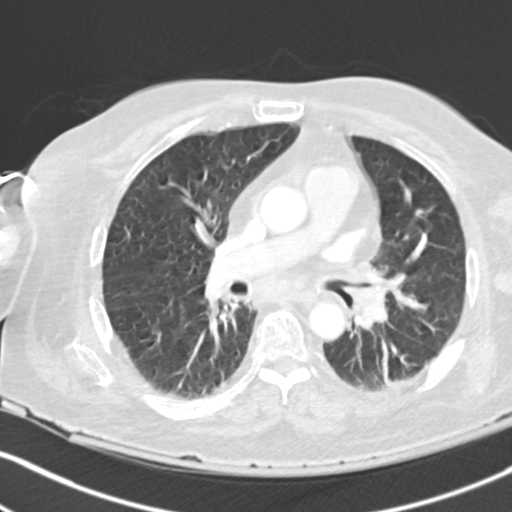
[im 142/258  mediastinal]
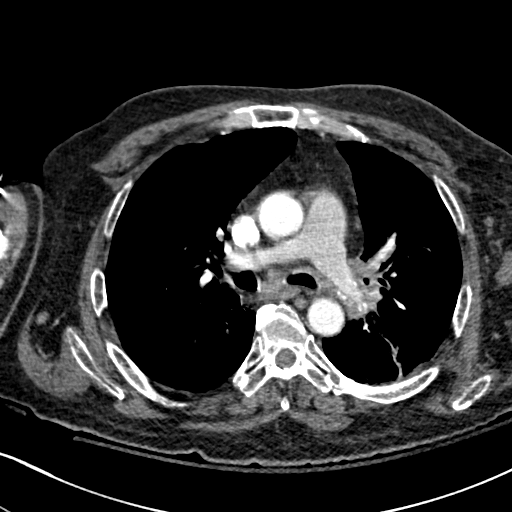
[im 155/258  lung]
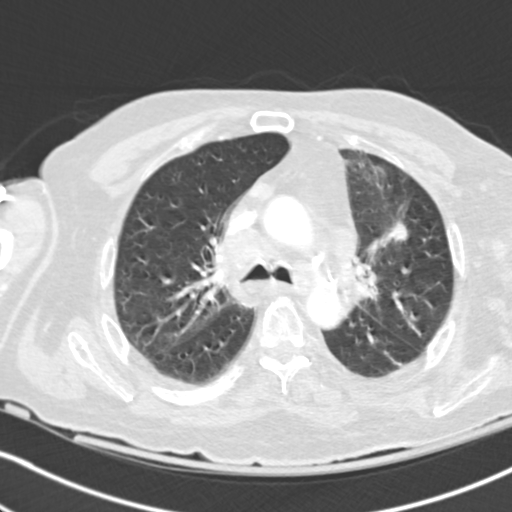
[im 168/258  mediastinal]
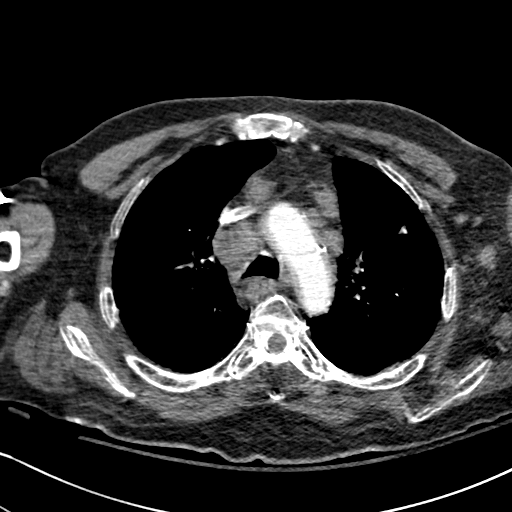
[im 180/258  lung]
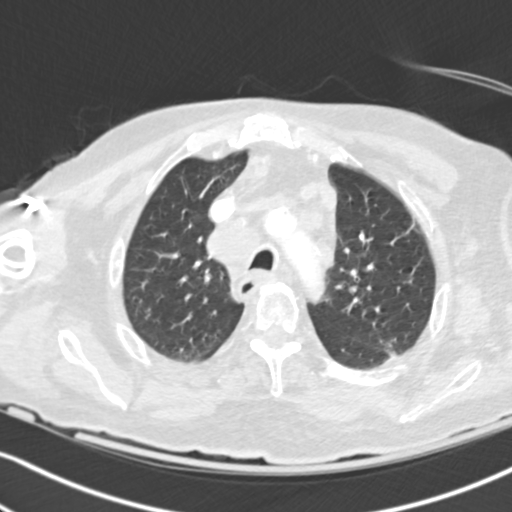
[im 206/258  mediastinal]
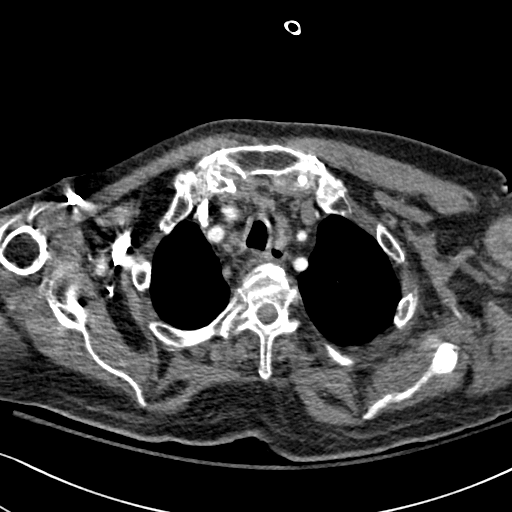
[im 219/258  lung]
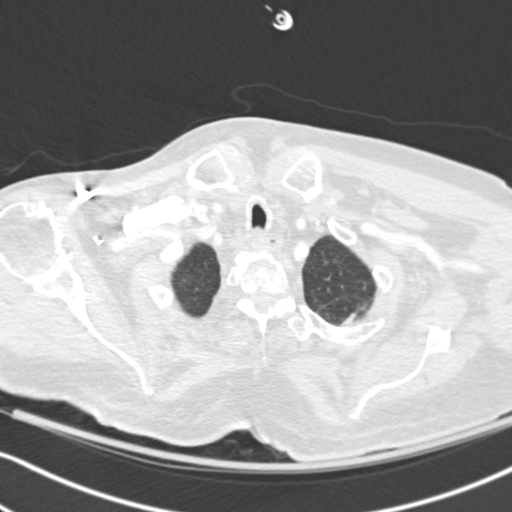
[im 232/258  mediastinal]
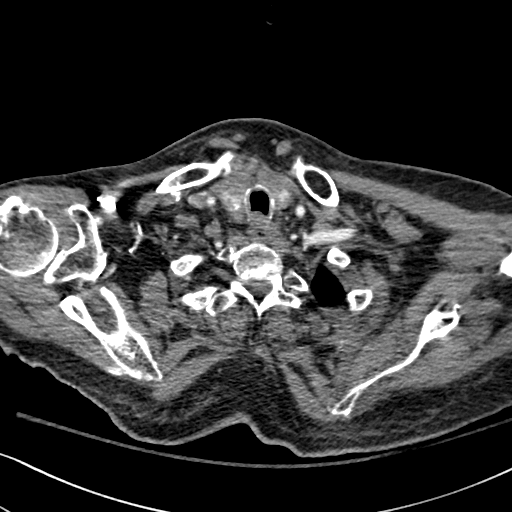
[im 245/258  lung]
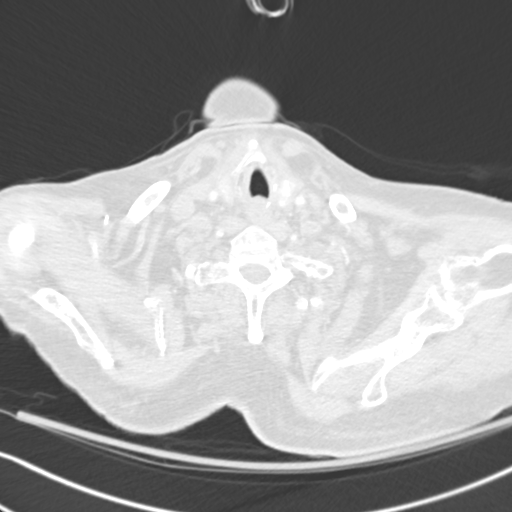

[Series 9: pe 2.0 coronal · coronal · 0.60mm/px · 1 of 144 slices shown]
[im 72/144  mediastinal]
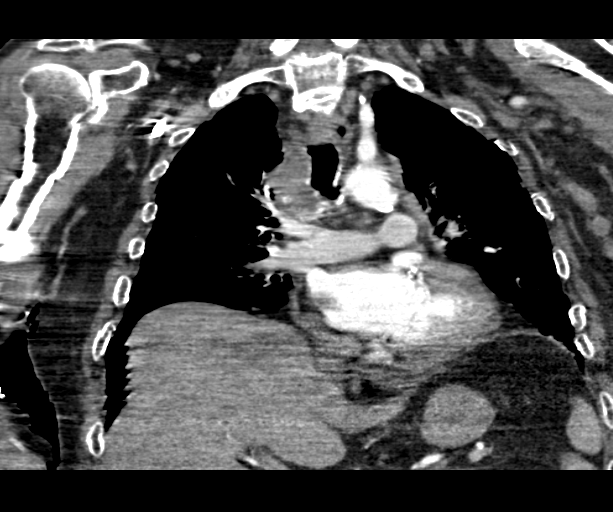

[18 of 36 positions shown; findings below may reference images not displayed]

FINDINGS: The study is degraded by patient motion and bolus timing. The
patient was reportedly combative during the examination. No
pulmonary embolus is identified. Left axillary lymph node seen on
the prior CT which had measured 2.5 cm in diameter today measures
2.1 cm on image 24. A right precarinal node which had measured 4.5 x
3.2 cm today measures 2.6 x 3.4 cm on image 34. A prevascular node
on image 28 measures 1.1 cm compared to 1.2 cm. No new or enlarging
lymph nodes are identified. Heart size is upper normal. There is
calcific coronary artery disease.

A 1.2 cm left upper lobe nodule seen on the prior study is unchanged
on image 36. Linear scarring in the left lower lobe is also again
seen. A nodular opacity deep in the left lung base which had
measured 0.6 cm today measures 1.0 cm on image 75. No nodule or mass
in the right is lung identified.

Imaged upper abdomen again shows a left adrenal mass which is
partially visualized and measures 3.0 x 3.4 cm on image 66 compared
to 4.0 x 3.6 cm on the prior examination. Imaged upper abdomen is
otherwise unremarkable. There is a new lucent lesion in the mid
thoracic vertebral body, likely T7 with a surrounding sclerotic rim
worrisome for metastatic disease.

Review of the MIP images confirms the above findings.
IMPRESSION: Limited study demonstrating no pulmonary embolus.

Lymphadenopathy in the chest and left axilla and left adrenal mass
seen on the prior study appear improved.

No change in a left upper lobe pulmonary nodule.

Increase in the size of a left lower lobe pulmonary nodule and a new
lucent lesion in a mid thoracic vertebral body, likely T7, worrisome
for progressive metastatic lung carcinoma.

## 2015-03-31 IMAGING — CR DG LUMBAR SPINE COMPLETE 4+V
5 series · 5 of 5 positions shown · non-contrast
Comparison: 09/18/2012 abdominal CT.

CLINICAL DATA: Back pain.  Stage IV lung cancer.

EXAM:
LUMBAR SPINE - COMPLETE 4+ VIEW

[t l-spine a.p.]
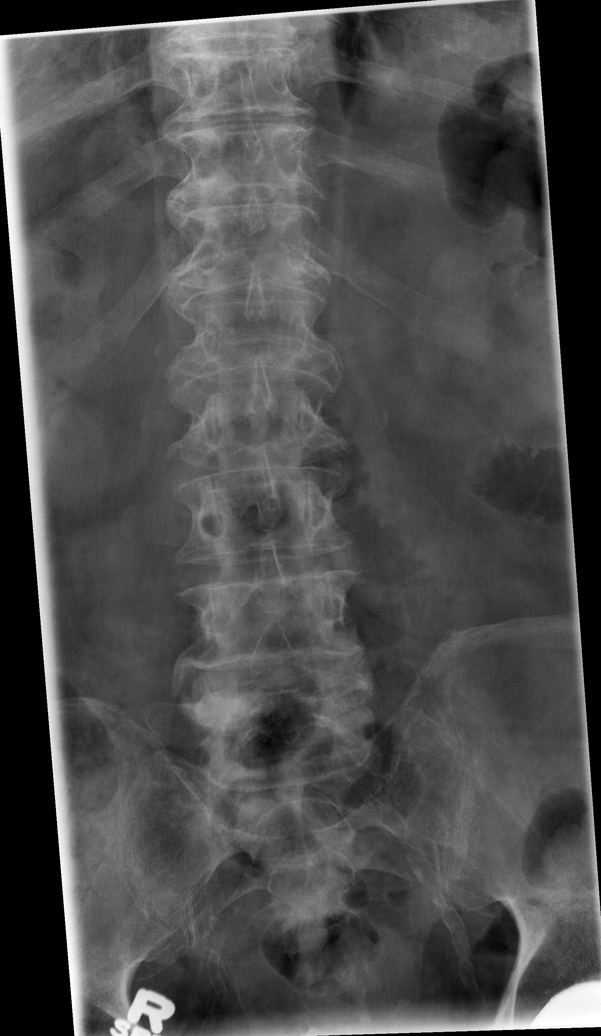

[t l-spine oblique exposure (1 of 2)]
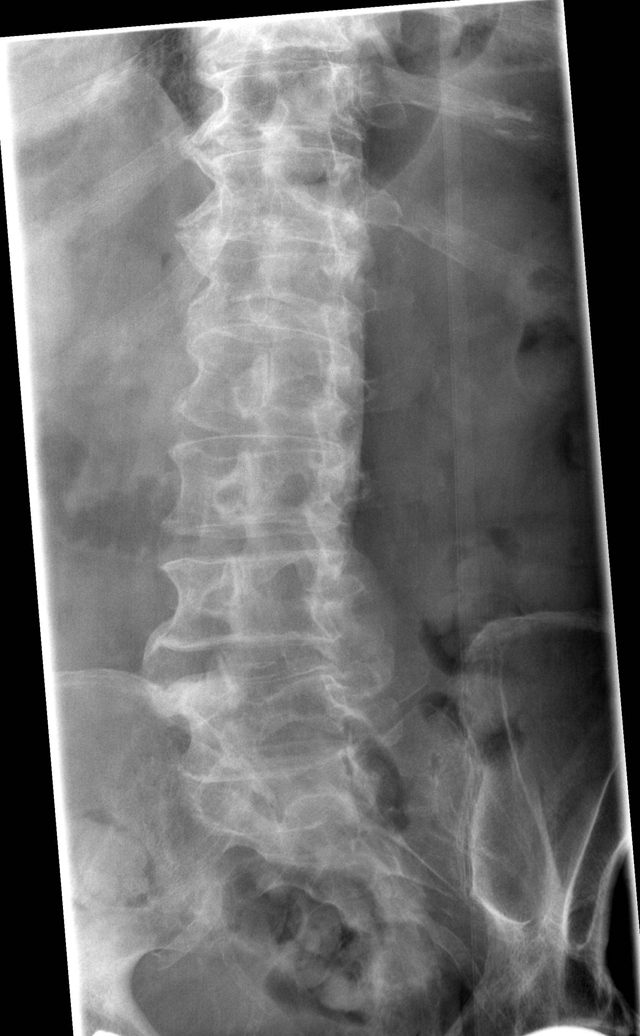

[t l-spine oblique exposure (2 of 2)]
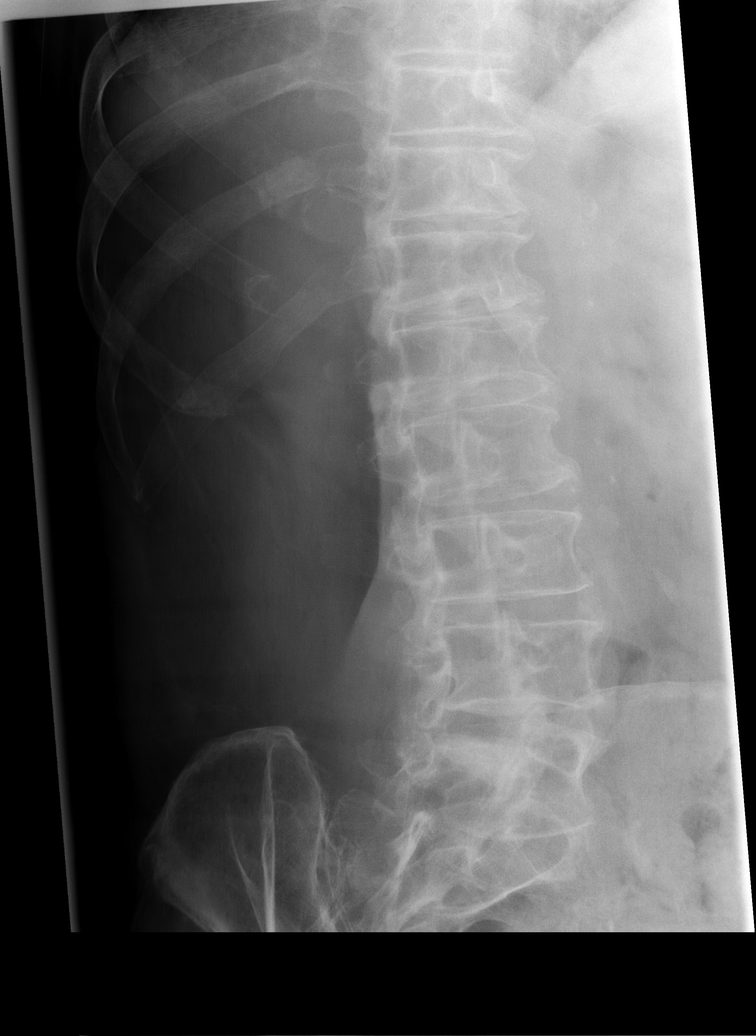

[t l-spine lat]
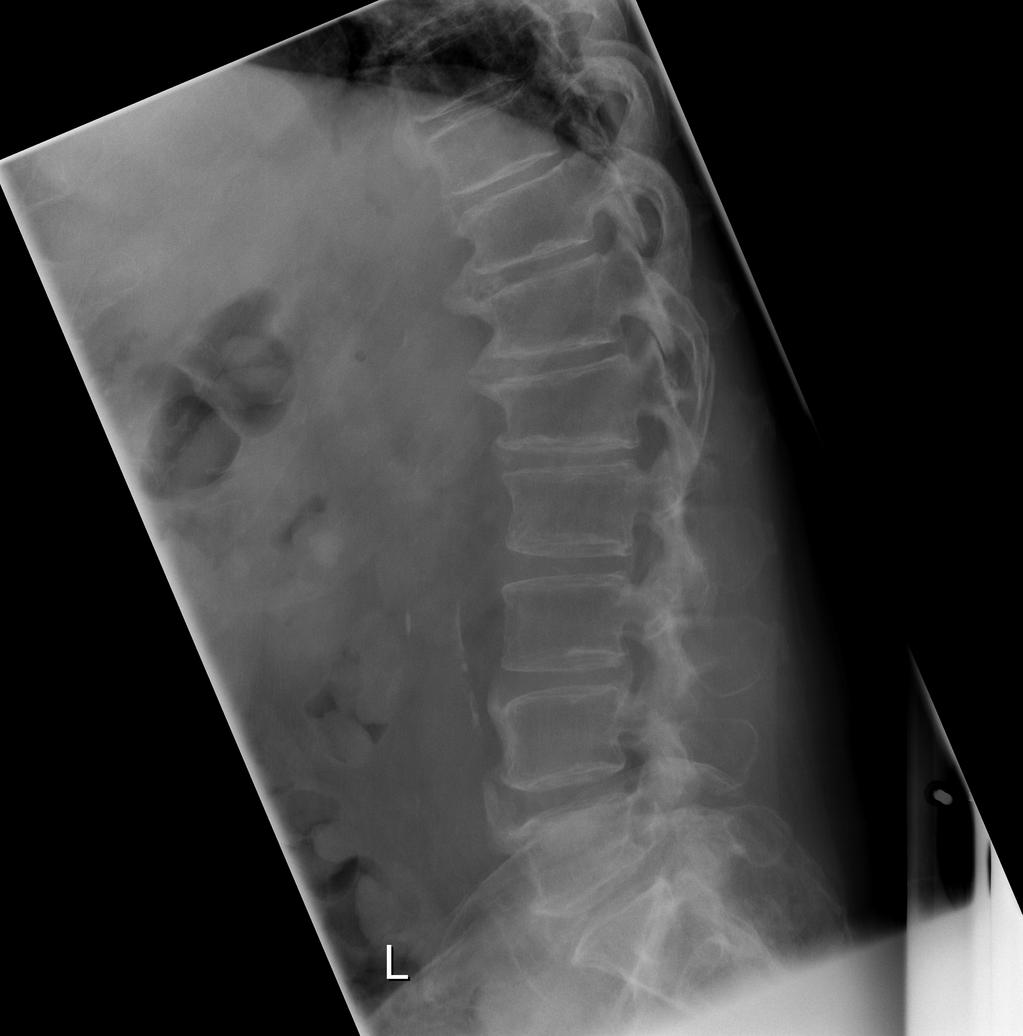

[t l-spine l5-s1 spot]
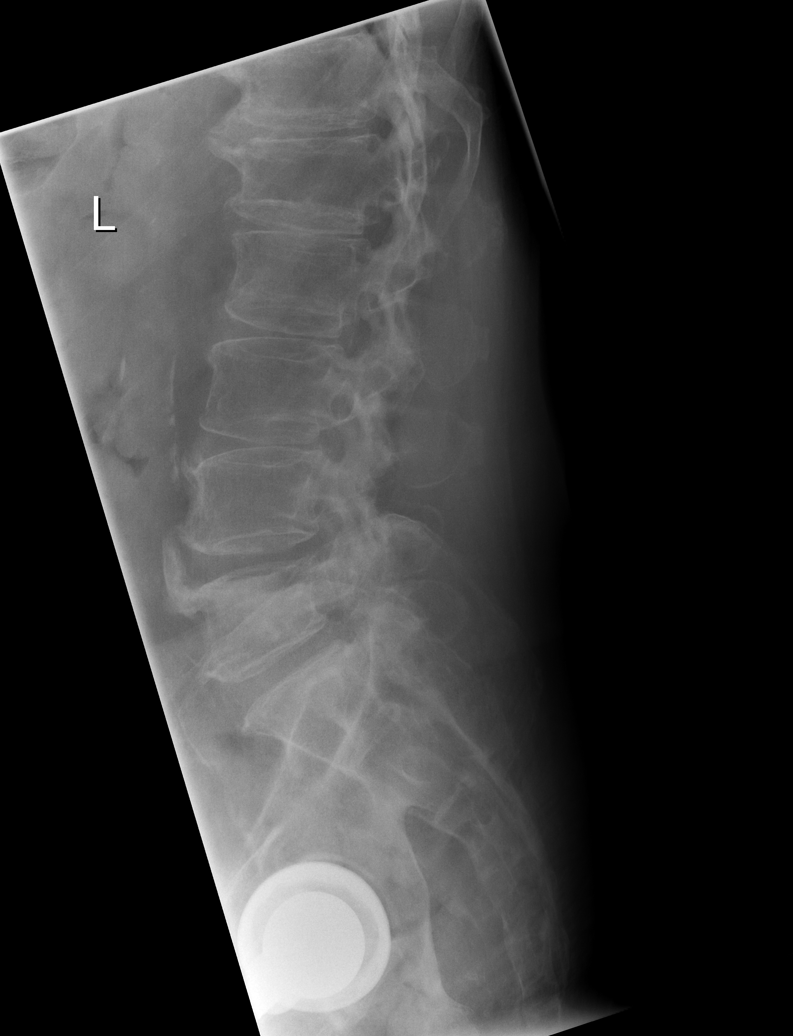

[5 of 5 positions shown; findings below may reference images not displayed]

FINDINGS: No acute fracture or subluxation. There is diffuse spondylosis with
T12 and L1 anterior wedging that is likely degenerative remodeling.
Lower lumbar facet osteoarthritis. No evidence of aggressive bone
lesion. Abdominal aortic atherosclerosis. 14 mm right renal stone is
seen in the lateral projection.
IMPRESSION: 1. No acute osseous abnormality.
2. Lumbar spondylosis.
